# Patient Record
Sex: Male | Born: 1950 | Race: White | Hispanic: No | Marital: Married | Smoking: Never smoker
Health system: Southern US, Community
[De-identification: ages and names within clinical notes are randomized; demographics above are authoritative.]

## PROBLEM LIST (undated history)

## (undated) DIAGNOSIS — N419 Inflammatory disease of prostate, unspecified: Secondary | ICD-10-CM

## (undated) DIAGNOSIS — I10 Essential (primary) hypertension: Secondary | ICD-10-CM

## (undated) DIAGNOSIS — K7689 Other specified diseases of liver: Secondary | ICD-10-CM

## (undated) DIAGNOSIS — R51 Headache: Secondary | ICD-10-CM

## (undated) DIAGNOSIS — I2510 Atherosclerotic heart disease of native coronary artery without angina pectoris: Secondary | ICD-10-CM

## (undated) DIAGNOSIS — J189 Pneumonia, unspecified organism: Secondary | ICD-10-CM

## (undated) DIAGNOSIS — K760 Fatty (change of) liver, not elsewhere classified: Secondary | ICD-10-CM

## (undated) DIAGNOSIS — M1990 Unspecified osteoarthritis, unspecified site: Secondary | ICD-10-CM

## (undated) DIAGNOSIS — E785 Hyperlipidemia, unspecified: Secondary | ICD-10-CM

## (undated) DIAGNOSIS — K8020 Calculus of gallbladder without cholecystitis without obstruction: Secondary | ICD-10-CM

## (undated) DIAGNOSIS — R935 Abnormal findings on diagnostic imaging of other abdominal regions, including retroperitoneum: Secondary | ICD-10-CM

## (undated) HISTORY — PX: ACROMIO-CLAVICULAR JOINT REPAIR: SHX5183

## (undated) HISTORY — DX: Calculus of gallbladder without cholecystitis without obstruction: K80.20

## (undated) HISTORY — DX: Abnormal findings on diagnostic imaging of other abdominal regions, including retroperitoneum: R93.5

## (undated) HISTORY — DX: Inflammatory disease of prostate, unspecified: N41.9

## (undated) HISTORY — DX: Unspecified osteoarthritis, unspecified site: M19.90

## (undated) HISTORY — DX: Headache: R51

## (undated) HISTORY — DX: Hyperlipidemia, unspecified: E78.5

## (undated) HISTORY — DX: Fatty (change of) liver, not elsewhere classified: K76.0

## (undated) HISTORY — DX: Essential (primary) hypertension: I10

## (undated) HISTORY — PX: VASECTOMY: SHX75

## (undated) HISTORY — DX: Other specified diseases of liver: K76.89

---
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
## 1979-04-24 HISTORY — PX: ACROMIO-CLAVICULAR JOINT REPAIR: SHX5183

## 1996-07-09 ENCOUNTER — Encounter: Payer: Self-pay | Admitting: Family Medicine

## 1996-07-09 LAB — CONVERTED CEMR LAB: TSH: 1.88 microintl units/mL

## 2000-04-23 HISTORY — PX: TEMPORAL ARTERY BIOPSY / LIGATION: SUR132

## 2000-04-23 HISTORY — PX: OTHER SURGICAL HISTORY: SHX169

## 2000-05-10 ENCOUNTER — Encounter: Payer: Self-pay | Admitting: Family Medicine

## 2000-05-10 LAB — CONVERTED CEMR LAB
Blood Glucose, Fasting: 122 mg/dL
PSA: 0.4 ng/mL
RBC count: 5.1 10*6/uL
WBC, blood: 12.1 10*3/uL

## 2000-05-30 ENCOUNTER — Encounter: Payer: Self-pay | Admitting: Family Medicine

## 2000-05-30 LAB — CONVERTED CEMR LAB: RBC count: 5.19 10*6/uL

## 2000-06-14 ENCOUNTER — Ambulatory Visit (HOSPITAL_COMMUNITY): Admission: RE | Admit: 2000-06-14 | Discharge: 2000-06-14 | Payer: Self-pay | Admitting: Family Medicine

## 2000-06-14 ENCOUNTER — Encounter: Payer: Self-pay | Admitting: Family Medicine

## 2000-07-22 HISTORY — PX: OTHER SURGICAL HISTORY: SHX169

## 2001-06-13 ENCOUNTER — Encounter: Payer: Self-pay | Admitting: Urology

## 2001-06-13 ENCOUNTER — Encounter: Admission: RE | Admit: 2001-06-13 | Discharge: 2001-06-13 | Payer: Self-pay | Admitting: Urology

## 2001-06-26 ENCOUNTER — Encounter: Payer: Self-pay | Admitting: Urology

## 2001-06-26 ENCOUNTER — Inpatient Hospital Stay (HOSPITAL_COMMUNITY): Admission: EM | Admit: 2001-06-26 | Discharge: 2001-07-03 | Payer: Self-pay | Admitting: Urology

## 2001-06-26 ENCOUNTER — Encounter (INDEPENDENT_AMBULATORY_CARE_PROVIDER_SITE_OTHER): Payer: Self-pay | Admitting: Specialist

## 2001-07-03 ENCOUNTER — Encounter: Payer: Self-pay | Admitting: Urology

## 2001-07-10 ENCOUNTER — Encounter: Admission: RE | Admit: 2001-07-10 | Discharge: 2001-07-10 | Payer: Self-pay | Admitting: Urology

## 2001-07-10 ENCOUNTER — Encounter: Payer: Self-pay | Admitting: Urology

## 2001-10-21 DIAGNOSIS — N419 Inflammatory disease of prostate, unspecified: Secondary | ICD-10-CM

## 2001-10-21 HISTORY — DX: Inflammatory disease of prostate, unspecified: N41.9

## 2001-11-10 DIAGNOSIS — R935 Abnormal findings on diagnostic imaging of other abdominal regions, including retroperitoneum: Secondary | ICD-10-CM

## 2001-11-10 HISTORY — DX: Abnormal findings on diagnostic imaging of other abdominal regions, including retroperitoneum: R93.5

## 2001-12-11 ENCOUNTER — Encounter: Payer: Self-pay | Admitting: Family Medicine

## 2001-12-11 LAB — CONVERTED CEMR LAB: PSA: 0.59 ng/mL

## 2004-11-07 ENCOUNTER — Ambulatory Visit: Payer: Self-pay | Admitting: Family Medicine

## 2005-01-03 ENCOUNTER — Ambulatory Visit: Payer: Self-pay | Admitting: Family Medicine

## 2005-01-03 LAB — CONVERTED CEMR LAB
Blood Glucose, Fasting: 99 mg/dL
PSA: 0.59 ng/mL
TSH: 1.39 u[IU]/mL

## 2005-01-05 ENCOUNTER — Ambulatory Visit: Payer: Self-pay | Admitting: Family Medicine

## 2005-01-18 ENCOUNTER — Ambulatory Visit: Payer: Self-pay | Admitting: Gastroenterology

## 2005-01-31 ENCOUNTER — Encounter (INDEPENDENT_AMBULATORY_CARE_PROVIDER_SITE_OTHER): Payer: Self-pay | Admitting: Specialist

## 2005-01-31 ENCOUNTER — Ambulatory Visit: Payer: Self-pay | Admitting: Gastroenterology

## 2005-01-31 LAB — HM COLONOSCOPY

## 2005-02-01 ENCOUNTER — Ambulatory Visit: Payer: Self-pay | Admitting: Family Medicine

## 2005-11-13 ENCOUNTER — Ambulatory Visit: Payer: Self-pay | Admitting: Family Medicine

## 2006-03-28 ENCOUNTER — Ambulatory Visit: Payer: Self-pay | Admitting: Family Medicine

## 2006-04-01 ENCOUNTER — Ambulatory Visit: Payer: Self-pay | Admitting: Family Medicine

## 2006-04-01 LAB — CONVERTED CEMR LAB
PSA: 1.98 ng/mL
TSH: 1.13 microintl units/mL

## 2007-03-27 ENCOUNTER — Encounter (INDEPENDENT_AMBULATORY_CARE_PROVIDER_SITE_OTHER): Payer: Self-pay | Admitting: *Deleted

## 2007-07-08 ENCOUNTER — Encounter: Payer: Self-pay | Admitting: Family Medicine

## 2007-07-08 DIAGNOSIS — G44019 Episodic cluster headache, not intractable: Secondary | ICD-10-CM | POA: Insufficient documentation

## 2007-07-08 DIAGNOSIS — N4289 Other specified disorders of prostate: Secondary | ICD-10-CM | POA: Insufficient documentation

## 2007-07-08 DIAGNOSIS — J309 Allergic rhinitis, unspecified: Secondary | ICD-10-CM | POA: Insufficient documentation

## 2007-07-10 DIAGNOSIS — E7800 Pure hypercholesterolemia, unspecified: Secondary | ICD-10-CM | POA: Insufficient documentation

## 2007-07-10 DIAGNOSIS — I10 Essential (primary) hypertension: Secondary | ICD-10-CM | POA: Insufficient documentation

## 2007-07-18 ENCOUNTER — Ambulatory Visit: Payer: Self-pay | Admitting: Family Medicine

## 2007-07-19 LAB — CONVERTED CEMR LAB
Albumin: 4.8 g/dL (ref 3.5–5.2)
Bilirubin, Direct: 0.1 mg/dL (ref 0.0–0.3)
CO2: 27 meq/L (ref 19–32)
Chloride: 100 meq/L (ref 96–112)
Glucose, Bld: 81 mg/dL (ref 70–99)
HDL: 50 mg/dL (ref >39)
LDL Cholesterol: 132 mg/dL — ABNORMAL HIGH (ref 0–99)
PSA: 0.65 ng/mL (ref 0.10–4.00)
Sodium: 140 meq/L (ref 135–145)
TSH: 0.902 microintl units/mL (ref 0.350–5.50)
Total Bilirubin: 0.9 mg/dL (ref 0.3–1.2)
Total CHOL/HDL Ratio: 4.3

## 2007-07-24 ENCOUNTER — Ambulatory Visit: Payer: Self-pay | Admitting: Family Medicine

## 2007-07-31 ENCOUNTER — Encounter: Payer: Self-pay | Admitting: Family Medicine

## 2007-08-07 ENCOUNTER — Encounter: Payer: Self-pay | Admitting: Family Medicine

## 2007-08-15 ENCOUNTER — Emergency Department (HOSPITAL_COMMUNITY): Admission: EM | Admit: 2007-08-15 | Discharge: 2007-08-16 | Payer: Self-pay | Admitting: Emergency Medicine

## 2008-01-19 ENCOUNTER — Encounter: Payer: Self-pay | Admitting: Family Medicine

## 2009-11-24 ENCOUNTER — Encounter (INDEPENDENT_AMBULATORY_CARE_PROVIDER_SITE_OTHER): Payer: Self-pay | Admitting: *Deleted

## 2010-02-20 ENCOUNTER — Ambulatory Visit: Payer: Self-pay | Admitting: Family Medicine

## 2010-02-21 LAB — CONVERTED CEMR LAB
Albumin: 4.1 g/dL (ref 3.5–5.2)
Alkaline Phosphatase: 54 units/L (ref 39–117)
Cholesterol: 206 mg/dL — ABNORMAL HIGH (ref 0–200)
Direct LDL: 125.3 mg/dL
GFR calc non Af Amer: 78.22 mL/min (ref >60)
HDL: 44.1 mg/dL (ref >39.00)
PSA: 0.66 ng/mL (ref 0.10–4.00)
Potassium: 4.7 meq/L (ref 3.5–5.1)
Sodium: 141 meq/L (ref 135–145)
TSH: 1.32 microintl units/mL (ref 0.35–5.50)
Total Protein: 6.9 g/dL (ref 6.0–8.3)
Triglycerides: 198 mg/dL — ABNORMAL HIGH (ref 0.0–149.0)
VLDL: 39.6 mg/dL (ref 0.0–40.0)

## 2010-02-23 ENCOUNTER — Ambulatory Visit: Payer: Self-pay | Admitting: Family Medicine

## 2010-05-23 NOTE — Letter (Signed)
Summary: Nadara Eaton letter  [place] at [facility]  [address] [place], [place] [address]   Phone: [phone]  Fax: [phone]       11/24/2009 MRN: [id number]  David Villanueva [address] [place], [place]  [address]  Dear Mr. [name] [facility] [place], and [place] announce the retirement of Arta Silence, M.D., from full-time practice at the [facility] office effective October 20, 2009 and his plans of returning part-time.  It is important to Dr. Hetty Ely and to our practice that you understand that [facility] has seven physicians in our office for your health care needs.  We will continue to offer the same exceptional care that you have today.    Dr. Hetty Ely has spoken to many of you about his plans for retirement and returning part-time in the fall.   We will continue to work with you through the transition to schedule appointments for you in the office and meet the high standards that [place] is committed to.   Again, it is with great pleasure that we share the news that Dr. Hetty Ely will return to [facility] in October of 2011 with a reduced schedule.    If you have any questions, or would like to request an appointment with one of our physicians, please call us at [phone] and press the option for Scheduling an appointment.  We take pleasure in providing you with excellent patient care and look forward to seeing you at your next office visit.  Our [facility] Physicians are:  Tillman Abide, M.D. Laurita Quint, M.D. Roxy Manns, M.D. Kerby Nora, M.D. Hannah Beat, M.D. Ruthe Mannan, M.D. We proudly welcomed Raechel Ache, M.D. and Eustaquio Boyden, M.D. to the practice in July/August 2011.  Sincerely,  [facility]

## 2010-05-23 NOTE — Assessment & Plan Note (Signed)
Summary: cpx   Vital Signs:  Patient profile:   60 year old male Height:      70 inches Weight:      199 pounds BMI:     28.66 Temp:     97.3 degrees F oral Pulse rate:   60 / minute Pulse rhythm:   regular BP sitting:   132 / 78  (left arm) Cuff size:   large  Vitals Entered By: Sydell Axon LPN 03-21-10 10:43 AM) CC: 30 Minute checkup, had a colonoscopy 10/06 by Dr. Christella Hartigan   History of Present Illness: Pt here for Comp Exam. He is living in the [address] and in a condo in [place].  he feels well and has teeth issues. He has had a crack in a tooth that failed and then had trhe tooth pulled with shifting of the teeth. He is now going to an Transport planner.  He is on HCTZ via Dr Shari Heritage, cardiologist in [place].  Preventive Screening-Counseling & Management  Alcohol-Tobacco     Alcohol drinks/day: 0     Smoking Status: never     Passive Smoke Exposure: no  Caffeine-Diet-Exercise     Caffeine use/day: 3     Does Patient Exercise: no     Type of exercise: works Holiday representative.  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 One Tab By Mouth Qam. 2)  Combination Bp Has No Idea What It Is  Allergies: 1)  ! * Demerol 2)  ! * Osmoprep  Past History:  Past Surgical History: Last updated: 07/24/2007 VASECTOMY LEFT A/C SEPERATION MRI// MRA BRAIN:--NEGATIVE:(2002) TEMPORAL ARTERY:--NEGATIVE:(2002) CAROTID DOPPLER NEGATIVE// ALSO MRA NEGATIVE:(07/2000) PROSTATITIS-- PROSTATE ABSCESS:(DR. TANNENBAUM):(10/2001) C.T. OF PELVIS NORMAL:(11/10/2001) COLONOSCOPY POLYP; BIOPSY NEGATIVE: (Dr  Christella Hartigan) (01/31/2005)   Family History: Last updated: 03/21/10 Father:DECEASED AT 41 YOA OPEN HEART SURGERY// MI MULTIPLE TIMES  Mother: A  85  RUPTURED DISC SISTER A  55  ?Lyme Dz CV: + FATHER MI/ +PGF 96 YOA HBP: +PGM/ + SELF DM: NEGATIVE GOUT/ARTHRITIS: NEGATIVE PROSTATE CANCER:  BREAST/OVARIAN/UTERINE CANCER: ?PGM DECEASED AT 89 YOA COLON CANCER: + MAUNT DEPRESSION: NEGATIVE ETOH/DRUG ABUSE: NEGATIVE OTHER: + STROKE PGF  Social History: Last updated: 21-Mar-2010 Marital Status: Married  LIVES WITH WIFE Children: 2 CHILDREN 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  Risk Factors: Alcohol Use: 0 (03-21-10) Caffeine Use: 3 (2010/03/21) Exercise: no (03-21-2010)  Risk Factors: Smoking Status: never (2010/03/21) Passive Smoke Exposure: no (03/21/10)  Family History: Father:DECEASED AT 41 YOA OPEN HEART SURGERY// MI MULTIPLE TIMES  Mother: A  85  RUPTURED DISC SISTER A  55  ?[place]yme Dz CV: + FATHER MI/ +PGF 96 YOA HBP: +PGM/ + SELF DM: NEGATIVE GOUT/ARTHRITIS: NEGATIVE PROSTATE CANCER:  BREAST/OVARIAN/UTERINE CANCER: ?PGM DECEASED AT 89 YOA COLON CANCER: + MAUNT DEPRESSION: NEGATIVE ETOH/DRUG ABUSE: NEGATIVE OTHER: + STROKE PGF  Social History: Marital Status: Married  LIVES WITH WIFE Children: 2 CHILDREN 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  Review of Systems General:  Denies chills, fatigue, fever, sweats, weakness, and weight loss. Eyes:  Complains of  blurring; denies discharge and eye pain; presbyopia. ENT:  Denies decreased hearing, earache, and ringing in ears. CV:  Denies chest pain or discomfort, fainting, fatigue, palpitations, shortness of breath with exertion, swelling of feet, and swelling of hands. Resp:  Denies cough, shortness of breath, and wheezing. GI:  Denies abdominal pain, bloody stools, change in  bowel habits, constipation, dark tarry stools, diarrhea, indigestion, loss of appetite, nausea, vomiting, vomiting blood, and yellowish skin color. GU:  Denies discharge, dysuria, nocturia, and urinary frequency. MS:  Denies joint pain, low back pain, muscle aches, cramps, and stiffness. Derm:  Denies dryness, itching, and rash. Neuro:  Denies numbness, poor balance, tingling, and tremors.  Physical Exam  General:  Well-developed,well-nourished,in no acute distress; alert,appropriate and cooperative throughout examination Head:  Normocephalic and atraumatic without obvious abnormalities. No apparent alopecia or balding. Eyes:  Conjunctiva clear bilaterally.  Ears:  External ear exam shows no significant lesions or deformities.  Otoscopic examination reveals clear canals, tympanic membranes are intact bilaterally without bulging, retraction, inflammation or discharge. Hearing is grossly normal bilaterally. Nose:  External nasal examination shows no deformity or inflammation. Nasal mucosa are pink and moist without lesions or exudates. Mouth:  Oral mucosa and oropharynx without lesions or exudates.  Teeth in good repair. Neck:  No deformities, masses, or tenderness noted. Chest Wall:  No deformities, masses, tenderness or gynecomastia noted. Breasts:  No masses or gynecomastia noted Lungs:  Normal respiratory effort, chest expands symmetrically. Lungs are clear to auscultation, no crackles or wheezes. Heart:  Normal rate and regular rhythm. S1 and S2 normal without gallop, murmur, click, rub or other extra sounds. Abdomen:  Bowel sounds positive,abdomen soft and non-tender without masses, organomegaly or hernias noted. Rectal:  No external abnormalities noted. Normal sphincter tone. No rectal masses or tenderness. Ext decompressed hemms. G neg. Genitalia:  Testes bilaterally descended without nodularity, tenderness or masses. No scrotal masses or lesions. No penis lesions or urethral  discharge. Prostate:  Prostate gland firm and smooth, no enlargement, nodularity, tenderness, mass, asymmetry or induration. 10gms. Msk:  No deformity or scoliosis noted of thoracic or lumbar spine.   Pulses:  R and L carotid,radial,femoral,dorsalis pedis and posterior tibial pulses are full and equal bilaterally Extremities:  No clubbing, cyanosis, edema, or deformity noted with normal full range of motion of all joints.   Neurologic:  No cranial nerve deficits noted. Station and gait are normal. Sensory, motor and coordinative functions appear intact. Skin:  Intact without suspicious lesions or rashes, Aks and Acne scarring of the shoulders and upper back. Cervical Nodes:  No lymphadenopathy noted Inguinal Nodes:  No significant adenopathy Psych:  Cognition and judgment appear intact. Alert and cooperative with normal attention span and concentration. No apparent delusions, illusions, hallucinations   Impression & Recommendations:  Problem # 1:  HEALTH MAINTENANCE EXAM (ICD-V70.0) Assessment Comment Only  Reviewed preventive care protocols, scheduled due services, and updated immunizations.  Problem # 2:  SPECIAL SCREENING MALIGNANT NEOPLASM OF PROSTATE (ICD-V76.44) Assessment: Unchanged Stable PSA and exam.  Problem # 3:  HYPERTENSION (ICD-401.9) Assessment: Unchanged Stable. His updated medication list for this problem includes:    Hydrochlorothiazide 25 Mg Tabs (Hydrochlorothiazide) ..... One tab by mouth every am.  BP today: 132/78 Prior BP: 120/80 (07/24/2007)  Labs Reviewed: K+: 4.7 (02/20/2010) Creat: : 1.0 (02/20/2010)   Chol: 206 (02/20/2010)   HDL: 44.10 (02/20/2010)   LDL: 132 (07/18/2007)   TG: 198.0 (02/20/2010)  Problem # 4:  HYPERCHOLESTEROLEMIA (ICD-272.0) Assessment: Unchanged OK but LDL and  Trigs could be improved for better future health...watch diet more carefully. 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   Complete Medication List: 1)  Hydrochlorothiazide 25 Mg Tabs (Hydrochlorothiazide) .... One tab by mouth every am.  Patient Instructions: 1)  RTC as needed.   Orders Added: 1)  Est. Patient 40-64 years [99396]    Current Allergies (reviewed today): ! * DEMEROL ! * OSMOPREP

## 2010-09-08 NOTE — H&P (Signed)
[facility]  Patient:    David Villanueva, David Villanueva Visit Number: [id number] MRN: [id number]          Service Type: MED Location: 3W 0371 01 Attending Physician:  Laqueta Jean Dictated by:   Vonzell Schlatter [name], M.D. Admit Date:  06/26/2001                           History and Physical  HISTORY OF PRESENT ILLNESS:  The patient is a 60 year old married white male from [place], [place], who has been treated in the outpatient setting for acute prostatitis.  The patient was treated with Levaquin 500 mg 1 per day, hot baths, and analgesics.  The patient returned after a 10 course, but was no better.  He was continued on his antibiotic, but returned today with urinary retention of 250 cc.  The patient had prostate ultrasound, showing possible developing prostate abscess.  Foley catheter was placed for acute urinary retention but the patient was unable to tolerate pain from irritation of the catheter on the prostate, and is now admitted for IV antibiotic, and pain medication.  He will undergo a cystoscopy and transrectal ultrasound and possible drainage of his abscess.  PAST MEDICAL HISTORY:  Noncontributory.  ALCOHOL:  Occasional.  TOBACCO:  None.  ALLERGIES: TETRACYCLINE - flank pain.  SOCIAL HISTORY:  The patient is married, with no children.  PHYSICAL EXAMINATION:  GENERAL:  A well-developed white male in no acute distress.  VITAL SIGNS:  Temperature 97.1, heart rate 58, respiratory rate 18, blood pressure 161/97.  Weight is 193.6.  HEENT:  PERRL, EOM full.  NECK:  Supple, nontender, no nodes.  CHEST:  Clear to percussion and auscultation.  ABDOMEN:  Soft, positive bowel sounds, no organomegaly, without masses.  RECTAL/GU:  Shows acute hot, swollen prostate which is tender to palpation. Pubic area is negative.  Testicles are descending bilaterally without evidence of orchitis or epididymitis.  EXTREMITIES:  Without cyanosis  or edema.  NEUROLOGIC:  Physiologic.  IMPRESSION:  Prostatitis with impending prostatic abscess. Dictated by:   Vonzell Schlatter [name], M.D. Attending Physician:  Laqueta Jean DD:  06/26/01 TD:  06/27/01 Job: [id number]

## 2010-09-08 NOTE — Op Note (Signed)
[facility]  Patient:    David Villanueva, David Villanueva Visit Number: [id number] MRN: [id number]          Service Type: MED Location: 3W 0371 01 Attending Physician:  Laqueta Jean Dictated by:   Vonzell Schlatter [name], M.D. Proc. 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  PROCEDURE:  Flexible cystoscopy and suprapubic catheterization.  SURGEON:  Sigmund I. [name], M.D.  ANESTHESIA:  General LMA.  PREPARATION:  After appropriate preanesthesia, the patient is brought to the operating room and placed on the operating table in the dorsal supine position, where general LMA and mask anesthesia was introduced.  He remained in the supine position, where the pubis was shaven, prepped with Betadine solution, draped in the usual fashion.  DESCRIPTION OF PROCEDURE:  Flexible cystoscopy revealed a hyperemic prostate with acute and chronic inflammatory changes.  With the patient in slight Trendelenburg position, the Microvasive suprapubic catheter was placed under direct vision into the bladder and a J coiled with a suture and locked into position.  The bladder drained clear fluid, and the cystoscope was removed. The patient was then awakened and taken to recovery in good condition. Dictated by:   Vonzell Schlatter [name], M.D. Attending Physician:  Laqueta Jean DD:  07/02/01 TD:  07/03/01 Job: [id number]

## 2010-09-08 NOTE — Discharge Summary (Signed)
[facility]  Patient:    David Villanueva, David Villanueva Visit Number: [id number] MRN: [id number]          Service Type: MED Location: 3W 0371 01 Attending Physician:  Laqueta Jean Dictated by:   Vonzell Schlatter [name], M.D. Admit Date:  06/26/2001 Discharge Date: 07/03/2001                             Discharge Summary  FINAL DIAGNOSES: 1. Acute on chronic prostatitis. 2. Prostatic infarction. 3. Chronic urinary retention.  PROCEDURES: 1. June 30, 2001, cystourethroscopy, ultrasonography with interpretation of    bladder, transrectal needle biopsy of the prostate. 2. July 02, 2001, flexible cystoscopy and suprapubic catheterization.  HISTORY OF PRESENT ILLNESS:  Mr. Carpenito is a 60 year old married white male from [place], [place].  He was treated in the outpatient setting for acute prostatitis with Septra double strength 1 b.i.d. and Levaquin.  However, after three weeks, the patient became worse with severe dysuria, acute urinary retention of 250 cc.  The patient had a prostatic ultrasound showing possible developing prostate abscess.  Foley catheter was placed.  Because of severe irritation from the catheterization, the patient was admitted for IV antibiotics and pain medication.  PAST MEDICAL HISTORY:  Noncontributory.  HABITS:  Alcohol: Occasional.  Tobacco: None.  ALLERGIES:  TETRACYCLINE (flank pain).  SOCIAL HISTORY:  The patient is married with no children.  ADMISSION PHYSICAL EXAMINATION:  GENERAL:  Well-developed white male in no acute distress.  The remaining physical examination is as noted on H&P of June 26, 2001.  ADMISSION LABORATORY DATA:  White blood cell count 9300, hemoglobin 13.3, hematocrit 38.4, platelet count 346,000.  Sodium 139, potassium 4.7, chloride 107, CO2 30, BUN 13, creatinine 1.0.  HOSPITAL COURSE:  The patient was given an IV course of gentamicin therapy per pharmacy protocol.  He underwent  cystoscopy and coincidental prostate ultrasonography with prostate biopsy of the abnormality found on ultrasound with the pathologic interpretation showing acute on chronic inflammation and infarction of the prostate.   The patient was given multiple attempts at voiding, but he continued to fail voiding trials despite double-dose Levaquin as well as low-dose Valium therapy.  Therefore, on July 02, 2001, the patient underwent flexible cystoscopy and insertion of suprapubic catheter.  He was allowed to be discharged with suprapubic tube in place.  This will be removed when the patient is able to void adequately on his own.  DISCHARGE MEDICATIONS:   He will be discharged on antibiotics and pain medications. Dictated by:   Vonzell Schlatter [name], M.D. Attending Physician:  Laqueta Jean DD:  07/02/01 TD:  07/04/01 Job: [id number]

## 2010-09-08 NOTE — Op Note (Signed)
[facility]  Patient:    David Villanueva, David Villanueva Visit Number: [id number] MRN: [id number]          Service Type: MED Location: 3W 0371 01 Attending Physician:  Laqueta Jean Dictated by:   Vonzell Schlatter [name], M.D. Proc. Date: 06/30/01 Admit Date:  06/26/2001                             Operative Report  PREOPERATIVE DIAGNOSIS:  Intraprostatic abscess.  POSTOPERATIVE DIAGNOSIS:  Intraprostatic abscess.  OPERATION:  Cystourethroscopy, transrectal prostatic massage, transrectal ultrasonography with interpretation, transrectal needle biopsy.  SURGEON:  Sigmund I. [name], M.D.  ANESTHESIA:  General (LMA)  PREPARATION:  After appropriate preanesthesia, the patient is brought to the operating room and placed upon the operating table in the dorsal supine position where a general LMA anesthesia was introduced.  He was then re-placed in the dorsal lithotomy position where the pubis was prepped with Betadine solution and draped in the usual fashion.  DESCRIPTION OF PROCEDURE:  Cystourethroscopy was accomplished with the rigid cystoscope, which showed a grossly inflamed prostatic fossa with thick mucus plugs emanating from the right side of the prostate.  This is consistent with a previous prostate ultrasound last week, which showed that the patient had an abnormal hypoechoic area consistent with prostatic abscess.  Vigorous massage was undertaken from the rectum, which showed that the prostate was 3+ and very firm.  Following this, transrectal ultrasonography was accomplished with interpretation, which again showed an abnormal hypoechoic area in the transitional zone, and biopsies were taken of this area.  There was no bleeding noted, and The patient tolerated this procedure well.  A sterile dressing was applied to the biopsy site.  Repeat cystoscopy showed some more mucus and clot within the bladder, and this was irrigated free from the bladder.   The patient was given IV Toradol as well as intraoperative antibiotic, awakened, and taken to the recovery room in good condition.  It was elected to leave the Foley catheter out to see if he could void. Dictated by:   Vonzell Schlatter [name], M.D. Attending Physician:  Laqueta Jean DD:  06/30/01 TD:  06/30/01 Job: [id number]

## 2010-11-27 ENCOUNTER — Telehealth: Payer: Self-pay | Admitting: *Deleted

## 2010-11-27 NOTE — Telephone Encounter (Signed)
I agree if David Villanueva agrees.

## 2010-11-27 NOTE — Telephone Encounter (Signed)
Patient would like to switch from Dr. Hetty Ely to Dr. Para March. Please advise.

## 2010-11-28 NOTE — Telephone Encounter (Signed)
Fine with me

## 2010-11-29 NOTE — Telephone Encounter (Signed)
Patient advised as instructed via telephone. 

## 2010-12-04 ENCOUNTER — Telehealth: Payer: Self-pay | Admitting: Family Medicine

## 2010-12-04 NOTE — Telephone Encounter (Signed)
Pt had CPE with David Villanueva 11/11.  Please schedule him for CPE in 12/12.  Thanks.

## 2010-12-04 NOTE — Telephone Encounter (Signed)
Patient notified as instructed by telephone. Appointments scheduled. 

## 2011-01-16 LAB — CBC
HCT: 46.5
Hemoglobin: 15.9
MCHC: 34.2
RDW: 13.1

## 2011-01-16 LAB — DIFFERENTIAL
Basophils Absolute: 0
Basophils Relative: 0
Eosinophils Relative: 1
Monocytes Absolute: 0.8
Monocytes Relative: 7

## 2011-01-16 LAB — POCT I-STAT, CHEM 8
Calcium, Ion: 1.15
Glucose, Bld: 127 — ABNORMAL HIGH
HCT: 48
Hemoglobin: 16.3
TCO2: 27

## 2011-04-03 ENCOUNTER — Other Ambulatory Visit: Payer: Self-pay

## 2011-04-05 ENCOUNTER — Encounter: Payer: Self-pay | Admitting: Family Medicine

## 2011-04-18 ENCOUNTER — Other Ambulatory Visit: Payer: Self-pay | Admitting: Family Medicine

## 2011-04-18 DIAGNOSIS — Z125 Encounter for screening for malignant neoplasm of prostate: Secondary | ICD-10-CM

## 2011-04-18 DIAGNOSIS — I10 Essential (primary) hypertension: Secondary | ICD-10-CM

## 2011-04-25 ENCOUNTER — Other Ambulatory Visit (INDEPENDENT_AMBULATORY_CARE_PROVIDER_SITE_OTHER): Payer: BC Managed Care – PPO

## 2011-04-25 DIAGNOSIS — Z125 Encounter for screening for malignant neoplasm of prostate: Secondary | ICD-10-CM

## 2011-04-25 DIAGNOSIS — I10 Essential (primary) hypertension: Secondary | ICD-10-CM

## 2011-04-25 LAB — COMPREHENSIVE METABOLIC PANEL
CO2: 33 mEq/L — ABNORMAL HIGH (ref 19–32)
Calcium: 9.9 mg/dL (ref 8.4–10.5)
Creatinine, Ser: 1.3 mg/dL (ref 0.4–1.5)
GFR: 62.42 mL/min (ref >60.00)
Glucose, Bld: 99 mg/dL (ref 70–99)
Total Bilirubin: 0.9 mg/dL (ref 0.3–1.2)

## 2011-04-25 LAB — LIPID PANEL
Cholesterol: 268 mg/dL — ABNORMAL HIGH (ref 0–200)
Triglycerides: 237 mg/dL — ABNORMAL HIGH (ref 0.0–149.0)

## 2011-04-25 LAB — PSA: PSA: 0.77 ng/mL (ref 0.10–4.00)

## 2011-04-26 ENCOUNTER — Encounter: Payer: Self-pay | Admitting: Family Medicine

## 2011-04-27 ENCOUNTER — Encounter: Payer: Self-pay | Admitting: Family Medicine

## 2011-04-27 ENCOUNTER — Ambulatory Visit (INDEPENDENT_AMBULATORY_CARE_PROVIDER_SITE_OTHER): Payer: BC Managed Care – PPO | Admitting: Family Medicine

## 2011-04-27 VITALS — BP 132/88 | HR 68 | Temp 97.8°F | Wt 209.0 lb

## 2011-04-27 DIAGNOSIS — Z23 Encounter for immunization: Secondary | ICD-10-CM

## 2011-04-27 DIAGNOSIS — I10 Essential (primary) hypertension: Secondary | ICD-10-CM

## 2011-04-27 DIAGNOSIS — E7800 Pure hypercholesterolemia, unspecified: Secondary | ICD-10-CM

## 2011-04-27 DIAGNOSIS — N4289 Other specified disorders of prostate: Secondary | ICD-10-CM

## 2011-04-27 DIAGNOSIS — Z0000 Encounter for general adult medical examination without abnormal findings: Secondary | ICD-10-CM

## 2011-04-27 MED ORDER — AMLODIPINE BESYLATE 10 MG PO TABS: 10.0000 mg | ORAL_TABLET | Freq: Every day | ORAL | Status: DC

## 2011-04-27 MED ORDER — HYDROCHLOROTHIAZIDE 12.5 MG PO TABS: 12.5000 mg | ORAL_TABLET | Freq: Every day | ORAL | Status: DC

## 2011-04-27 NOTE — Progress Notes (Signed)
CPE- See plan.  Routine anticipatory guidance given to patient.  See health maintenance.  Hypertension:    Using medication without problems or lightheadedness: yes Chest pain with exertion:no Edema:no Short of breath:no  Chol elevated, no meds yet.  D/w pt.   H/o prostatitis, PSA wnl.   PMH and SH reviewed  Meds, vitals, and allergies reviewed.   ROS: See HPI.  Otherwise negative.    GEN: nad, alert and oriented HEENT: mucous membranes moist NECK: supple w/o LA CV: rrr. PULM: ctab, no inc wob ABD: soft, +bs EXT: no edema SKIN: no acute rash Prostate gland firm and smooth, minimal enlargement, but no nodularity, tenderness, mass, asymmetry or induration.

## 2011-04-27 NOTE — Patient Instructions (Signed)
Work on your [employer].  Don't change your meds.  I sent them to the pharmacy.  Take care.   Recheck labs in 1 year at a physical.

## 2011-04-29 ENCOUNTER — Encounter: Payer: Self-pay | Admitting: Family Medicine

## 2011-04-29 DIAGNOSIS — Z0000 Encounter for general adult medical examination without abnormal findings: Secondary | ICD-10-CM | POA: Insufficient documentation

## 2011-04-29 NOTE — Assessment & Plan Note (Signed)
No urinary sx and PSA wnl.

## 2011-04-29 NOTE — Assessment & Plan Note (Signed)
Controlled, work on diet and exercise.  No change in meds.

## 2011-04-29 NOTE — Assessment & Plan Note (Signed)
Work on diet and recheck periodically.

## 2011-04-29 NOTE — Assessment & Plan Note (Signed)
Flu done, td and colon up to date.  D/w pt about exercise and diet.  Healthy habits encouraged.

## 2013-03-30 ENCOUNTER — Other Ambulatory Visit: Payer: Self-pay | Admitting: Family Medicine

## 2013-03-30 DIAGNOSIS — E7800 Pure hypercholesterolemia, unspecified: Secondary | ICD-10-CM

## 2013-04-06 ENCOUNTER — Other Ambulatory Visit (INDEPENDENT_AMBULATORY_CARE_PROVIDER_SITE_OTHER): Payer: BC Managed Care – PPO

## 2013-04-06 DIAGNOSIS — E7800 Pure hypercholesterolemia, unspecified: Secondary | ICD-10-CM

## 2013-04-06 LAB — LIPID PANEL
Cholesterol: 218 mg/dL — ABNORMAL HIGH (ref 0–200)
HDL: 35.9 mg/dL — ABNORMAL LOW
Total CHOL/HDL Ratio: 6
Triglycerides: 143 mg/dL (ref 0.0–149.0)
VLDL: 28.6 mg/dL (ref 0.0–40.0)

## 2013-04-06 LAB — LDL CHOLESTEROL, DIRECT: Direct LDL: 160.2 mg/dL

## 2013-04-06 LAB — COMPREHENSIVE METABOLIC PANEL
ALT: 31 U/L (ref 0–53)
AST: 21 U/L (ref 0–37)
Albumin: 4.6 g/dL (ref 3.5–5.2)
CO2: 28 mEq/L (ref 19–32)
Calcium: 9.4 mg/dL (ref 8.4–10.5)
Chloride: 101 mEq/L (ref 96–112)
Creatinine, Ser: 1.4 mg/dL (ref 0.4–1.5)
GFR: 57.26 mL/min — ABNORMAL LOW (ref >60.00)
Potassium: 4.3 mEq/L (ref 3.5–5.1)
Total Protein: 7.8 g/dL (ref 6.0–8.3)

## 2013-04-10 ENCOUNTER — Ambulatory Visit (INDEPENDENT_AMBULATORY_CARE_PROVIDER_SITE_OTHER): Payer: BC Managed Care – PPO | Admitting: Family Medicine

## 2013-04-10 ENCOUNTER — Encounter: Payer: Self-pay | Admitting: Family Medicine

## 2013-04-10 VITALS — BP 138/84 | HR 71 | Temp 97.8°F | Ht 70.5 in | Wt 219.2 lb

## 2013-04-10 DIAGNOSIS — Z0000 Encounter for general adult medical examination without abnormal findings: Secondary | ICD-10-CM

## 2013-04-10 DIAGNOSIS — I10 Essential (primary) hypertension: Secondary | ICD-10-CM

## 2013-04-10 MED ORDER — HYDROCHLOROTHIAZIDE 25 MG PO TABS: 12.5000 mg | ORAL_TABLET | Freq: Every day | ORAL | Status: DC

## 2013-04-10 MED ORDER — AMLODIPINE BESYLATE 10 MG PO TABS: 10.0000 mg | ORAL_TABLET | Freq: Every day | ORAL | Status: DC

## 2013-04-10 NOTE — Progress Notes (Signed)
Pre-visit discussion using our clinic review tool. No additional management support is needed unless otherwise documented below in the visit note.  CPE- See plan.  Routine anticipatory guidance given to patient.  See health maintenance. Tetanus 2006 Flu shot encouraged.  Shingles d/w pt.  Colonoscopy 2006 Prostate cancer screening and PSA options (with potential risks and benefits of testing vs not testing) were discussed along with recent recs/guidelines.  He declined testing PSA at this point. Living will d/w pt.  Wife designated if incapacitated.   Diet and exercise d/w pt.  Weight is up.  Discussed.  Encouraged.  His job situation affected his diet.  He has an exercise plan.    Hypertension:    Using medication without problems or lightheadedness: yes Chest pain with exertion:no Edema:no Short of breath:no  PMH and SH reviewed  Meds, vitals, and allergies reviewed.   ROS: See HPI.  Otherwise negative.    GEN: nad, alert and oriented HEENT: mucous membranes moist NECK: supple w/o LA CV: rrr. PULM: ctab, no inc wob ABD: soft, +bs EXT: no edema SKIN: no acute rash

## 2013-04-10 NOTE — Patient Instructions (Addendum)
Check with your insurance to see if they will cover the shingles shot. I would get a flu shot each fall.   Don't change your meds.  Work on diet and exercise.   Take care.  Glad to see you.

## 2013-04-11 NOTE — Assessment & Plan Note (Signed)
Continue current meds, labs discussed.  Work on diet and exercise, weight.  F/u prn.

## 2013-04-11 NOTE — Assessment & Plan Note (Signed)
Routine anticipatory guidance given to patient.  See health maintenance. Tetanus 2006 Flu shot encouraged.  Shingles d/w pt.  Colonoscopy 2006 Prostate cancer screening and PSA options (with potential risks and benefits of testing vs not testing) were discussed along with recent recs/guidelines.  He declined testing PSA at this point. Living will d/w pt.  Wife designated if incapacitated.   Diet and exercise d/w pt.  Weight is up.  Discussed.  Encouraged.  His job situation affected his diet.  He has an exercise plan.

## 2013-04-13 ENCOUNTER — Telehealth: Payer: Self-pay

## 2013-04-13 MED ORDER — ZOSTER VACCINE LIVE 19400 UNT/0.65ML ~~LOC~~ SOLR: 0.6500 mL | Freq: Once | SUBCUTANEOUS | Status: DC

## 2013-04-13 NOTE — Telephone Encounter (Signed)
Sent zosta, shouldn't need order for flu.

## 2013-04-13 NOTE — Telephone Encounter (Signed)
David Villanueva left v/m requesting shingles vaccine rx to [facility]. Wants to get injection ASAP. If needs order for flu shot wants that sent also.

## 2013-04-13 NOTE — Telephone Encounter (Signed)
David Villanueva request shingles vaccine before end of year. Advised to contact ins co to see if covered at doctor's office or pharmacy and David Villanueva will contact office to let know if schedule nurse visit or shingles vaccine rx to what pharmacy.

## 2013-04-14 NOTE — Telephone Encounter (Signed)
Left detailed message on voicemail.  

## 2013-04-21 ENCOUNTER — Encounter: Payer: Self-pay | Admitting: Family Medicine

## 2013-07-16 ENCOUNTER — Encounter: Payer: Self-pay | Admitting: *Deleted

## 2014-03-05 ENCOUNTER — Other Ambulatory Visit: Payer: Self-pay | Admitting: Family Medicine

## 2014-10-10 ENCOUNTER — Other Ambulatory Visit: Payer: Self-pay | Admitting: Family Medicine

## 2014-10-10 DIAGNOSIS — E7800 Pure hypercholesterolemia, unspecified: Secondary | ICD-10-CM

## 2014-10-10 DIAGNOSIS — I10 Essential (primary) hypertension: Secondary | ICD-10-CM

## 2014-10-12 ENCOUNTER — Other Ambulatory Visit (INDEPENDENT_AMBULATORY_CARE_PROVIDER_SITE_OTHER): Payer: BLUE CROSS/BLUE SHIELD

## 2014-10-12 DIAGNOSIS — I10 Essential (primary) hypertension: Secondary | ICD-10-CM

## 2014-10-12 LAB — COMPREHENSIVE METABOLIC PANEL
ALK PHOS: 57 U/L (ref 39–117)
ALT: 21 U/L (ref 0–53)
AST: 14 U/L (ref 0–37)
Albumin: 4.1 g/dL (ref 3.5–5.2)
BILIRUBIN TOTAL: 0.6 mg/dL (ref 0.2–1.2)
BUN: 17 mg/dL (ref 6–23)
CO2: 29 mEq/L (ref 19–32)
Calcium: 9.4 mg/dL (ref 8.4–10.5)
Chloride: 104 mEq/L (ref 96–112)
Creatinine, Ser: 1.13 mg/dL (ref 0.40–1.50)
GFR: 69.95 mL/min (ref >60.00)
GLUCOSE: 90 mg/dL (ref 70–99)
POTASSIUM: 4.6 meq/L (ref 3.5–5.1)
SODIUM: 137 meq/L (ref 135–145)
TOTAL PROTEIN: 6.9 g/dL (ref 6.0–8.3)

## 2014-10-12 LAB — LIPID PANEL
CHOL/HDL RATIO: 5
CHOLESTEROL: 177 mg/dL (ref 0–200)
HDL: 34.4 mg/dL — AB (ref >39.00)
LDL Cholesterol: 106 mg/dL — ABNORMAL HIGH (ref 0–99)
NonHDL: 142.6
TRIGLYCERIDES: 181 mg/dL — AB (ref 0.0–149.0)
VLDL: 36.2 mg/dL (ref 0.0–40.0)

## 2014-10-15 ENCOUNTER — Ambulatory Visit (INDEPENDENT_AMBULATORY_CARE_PROVIDER_SITE_OTHER): Payer: BLUE CROSS/BLUE SHIELD | Admitting: Family Medicine

## 2014-10-15 ENCOUNTER — Encounter: Payer: Self-pay | Admitting: Family Medicine

## 2014-10-15 VITALS — BP 130/100 | HR 60 | Temp 98.3°F | Ht 71.0 in | Wt 210.8 lb

## 2014-10-15 DIAGNOSIS — Z7189 Other specified counseling: Secondary | ICD-10-CM

## 2014-10-15 DIAGNOSIS — Z0000 Encounter for general adult medical examination without abnormal findings: Secondary | ICD-10-CM | POA: Diagnosis not present

## 2014-10-15 DIAGNOSIS — I10 Essential (primary) hypertension: Secondary | ICD-10-CM

## 2014-10-15 DIAGNOSIS — Z23 Encounter for immunization: Secondary | ICD-10-CM | POA: Diagnosis not present

## 2014-10-15 DIAGNOSIS — Z1211 Encounter for screening for malignant neoplasm of colon: Secondary | ICD-10-CM

## 2014-10-15 NOTE — Patient Instructions (Addendum)
David Villanueva will call about your referral. If your BP is consistently >140/>90, then I would restart start the HCTZ.  If you get lightheaded, then stop it and let me know.   Check your BP in the AM, after urinating and then sitting down for a few minutes.   Take care.  Glad to see you.

## 2014-10-15 NOTE — Progress Notes (Signed)
Pre visit review using our clinic review tool, if applicable. No additional management support is needed unless otherwise documented below in the visit note.  CPE- See plan.  Routine anticipatory guidance given to patient.  See health maintenance. Tetanus 2016 Flu encouraged.  Declined.  He was tangential about this- mentioning that flu shots weren't effective, that he didn't need them, and that drug companies were profiting off vaccines.  I told him that flu shots were currently the best option for prevention of the disease, he was a candidate for vaccination, and that if he thought I was profiting off vaccination then he should leave and find a different clinic.  I told him the evidence for vaccination mattered, but his personal opinions did not. We moved on.  PNA due at 67 Shingles 2014 Colonoscopy due in 2016. D/w pt.   Prostate cancer screening and PSA options (with potential risks and benefits of testing vs not testing) were discussed along with recent recs/guidelines.  He declined testing PSA at this point. Living will - wife designated if patient were incapacitated.  Diet and exercise d/w pt.  Doing well, exercise - walking.  Diet is good.  Family has worked on Warden/ranger.    Hypertension:  Weight is down.  Off meds.  Has had variable BPs at home. See AVS re: BP.   PMH and SH reviewed  Meds, vitals, and allergies reviewed.   ROS: See HPI.  Otherwise negative.    GEN: nad, alert and oriented HEENT: mucous membranes moist NECK: supple w/o LA CV: rrr. PULM: ctab, no inc wob ABD: soft, +bs, asymptomatic umbilical hernia noted (he has no need for intervention, d/w pt) EXT: no edema SKIN: no acute rash

## 2014-10-16 DIAGNOSIS — Z7189 Other specified counseling: Secondary | ICD-10-CM | POA: Insufficient documentation

## 2014-10-16 NOTE — Assessment & Plan Note (Signed)
Routine anticipatory guidance given to patient.  See health maintenance. Tetanus 2016 Flu encouraged.  Declined.  He was tangential about this- mentioning that flu shots weren't effective, that he didn't need them, and that drug companies were profiting off vaccines.  I told him that flu shots were currently the best option for prevention of the disease, he was a candidate for vaccination, and that if he thought I was profiting off vaccination then he should leave and find a different clinic.  I told him the evidence for vaccination mattered, but his personal opinions did not. We moved on.  PNA due at 67 Shingles 2014 Colonoscopy due in 2016. D/w pt.   Prostate cancer screening and PSA options (with potential risks and benefits of testing vs not testing) were discussed along with recent recs/guidelines.  He declined testing PSA at this point. Living will - wife designated if patient were incapacitated.  Diet and exercise d/w pt.  Doing well, exercise - walking.  Diet is good.  Family has worked on Warden/ranger.

## 2014-10-16 NOTE — Assessment & Plan Note (Signed)
If BP is consistently >140/>90, then restart start the HCTZ. If lightheaded, then stop it and he'll let me know.  Check BP in the AM, after urinating and then sitting down for a few minutes. Labs d/w pt.

## 2014-11-22 ENCOUNTER — Encounter: Payer: Self-pay | Admitting: Gastroenterology

## 2015-01-14 ENCOUNTER — Ambulatory Visit (AMBULATORY_SURGERY_CENTER): Payer: Self-pay

## 2015-01-14 VITALS — Ht 70.5 in | Wt 213.0 lb

## 2015-01-14 DIAGNOSIS — Z800 Family history of malignant neoplasm of digestive organs: Secondary | ICD-10-CM

## 2015-01-14 MED ORDER — MOVIPREP 100 G PO SOLR
1.0000 | Freq: Once | ORAL | Status: DC
Start: 2015-01-14 — End: 2015-01-28

## 2015-01-14 NOTE — Progress Notes (Signed)
No allergies to eggs or soy No diet/weight loss meds No past problems with anesthesia No home oxygen  Refused emmi 

## 2015-01-28 ENCOUNTER — Encounter: Payer: Self-pay | Admitting: Gastroenterology

## 2015-01-28 ENCOUNTER — Ambulatory Visit (AMBULATORY_SURGERY_CENTER): Payer: BLUE CROSS/BLUE SHIELD | Admitting: Gastroenterology

## 2015-01-28 VITALS — BP 154/68 | HR 54 | Temp 97.0°F | Resp 15 | Ht 70.5 in | Wt 213.0 lb

## 2015-01-28 DIAGNOSIS — Z800 Family history of malignant neoplasm of digestive organs: Secondary | ICD-10-CM

## 2015-01-28 DIAGNOSIS — D122 Benign neoplasm of ascending colon: Secondary | ICD-10-CM

## 2015-01-28 DIAGNOSIS — Z1211 Encounter for screening for malignant neoplasm of colon: Secondary | ICD-10-CM | POA: Diagnosis present

## 2015-01-28 MED ORDER — SODIUM CHLORIDE 0.9 % IV SOLN: 500.0000 mL | INTRAVENOUS | Status: DC

## 2015-01-28 NOTE — Progress Notes (Signed)
Transferred to recovery room. A/O x3, pleased with MAC.  VSS.  Report to Jane, RN. 

## 2015-01-28 NOTE — Op Note (Signed)
[facility]  [address] [place], [address]   COLONOSCOPY PROCEDURE REPORT  PATIENT: David Villanueva, David Villanueva  MR#: [id number] BIRTHDATE: [birth year] , 71  yrs. old GENDER: male ENDOSCOPIST: Milus Banister, MD PROCEDURE DATE:  01/28/2015 PROCEDURE:   Colonoscopy, screening and Colonoscopy with snare polypectomy First Screening Colonoscopy - Avg.  risk and is 50 yrs.  old or older - No.  Prior Negative Screening - Now for repeat screening. 10 or more years since last screening  History of Adenoma - Now for follow-up colonoscopy & has been > or = to 3 yrs.  N/A  Polyps removed today? Yes ASA CLASS:   Class II INDICATIONS:Screening for colonic neoplasia and Colorectal Neoplasm Risk Assessment for this procedure is average risk. MEDICATIONS: Monitored anesthesia care and Propofol 200 mg IV  DESCRIPTION OF PROCEDURE:   After the risks benefits and alternatives of the procedure were thoroughly explained, informed consent was obtained.  The digital rectal exam revealed no abnormalities of the rectum.   The LB [id number]  endoscope was introduced through the anus and advanced to the cecum, which was identified by both the appendix and ileocecal valve. No adverse events experienced.   The quality of the prep was excellent.  The instrument was then slowly withdrawn as the colon was fully examined. Estimated blood loss is zero unless otherwise noted in this procedure report.   COLON FINDINGS: A sessile polyp measuring 5 mm in size was found in the ascending colon.  A polypectomy was performed with a cold snare.  The resection was complete, the polyp tissue was completely retrieved and sent to histology.   The examination was otherwise normal.  Retroflexed views revealed no abnormalities. The time to cecum = 3.4 Withdrawal time = 7.7   The scope was withdrawn and the procedure completed. COMPLICATIONS: There were no immediate complications.  ENDOSCOPIC IMPRESSION: 1.    Sessile polyp was found in the ascending colon; polypectomy was performed with a cold snare 2.   The examination was otherwise normal  RECOMMENDATIONS: If the polyp(s) removed today are proven to be adenomatous (pre-cancerous) polyps, you will need a repeat colonoscopy in 5 years.  Otherwise you should continue to follow colorectal cancer screening guidelines for "routine risk" patients with colonoscopy in 10 years.  You will receive a letter within 1-2 weeks with the results of your biopsy as well as final recommendations.  Please call my office if you have not received a letter after 3 weeks.  eSigned:  Milus Banister, MD 01/28/2015 10:59 AM   cc: Elsie Stain, MD

## 2015-01-28 NOTE — Progress Notes (Signed)
Called to room to assist during endoscopic procedure.  Patient ID and intended procedure confirmed with present staff. Received instructions for my participation in the procedure from the performing physician.  

## 2015-01-28 NOTE — Patient Instructions (Signed)
YOU HAD AN ENDOSCOPIC PROCEDURE TODAY AT THE [facility]:   Refer to the procedure report that was given to you for any specific questions about what was found during the examination.  If the procedure report does not answer your questions, please call your gastroenterologist to clarify.  If you requested that your care partner not be given the details of your procedure findings, then the procedure report has been included in a sealed envelope for you to review at your convenience later.  YOU SHOULD EXPECT: Some feelings of bloating in the abdomen. Passage of more gas than usual.  Walking can help get rid of the air that was put into your GI tract during the procedure and reduce the bloating. If you had a lower endoscopy (such as a colonoscopy or flexible sigmoidoscopy) you may notice spotting of blood in your stool or on the toilet paper. If you underwent a bowel prep for your procedure, you may not have a normal bowel movement for a few days.  Please Note:  You might notice some irritation and congestion in your nose or some drainage.  This is from the oxygen used during your procedure.  There is no need for concern and it should clear up in a day or so.  SYMPTOMS TO REPORT IMMEDIATELY:   Following lower endoscopy (colonoscopy or flexible sigmoidoscopy):  Excessive amounts of blood in the stool  Significant tenderness or worsening of abdominal pains  Swelling of the abdomen that is new, acute  Fever of 100F or higher   For urgent or emergent issues, a gastroenterologist can be reached at any hour by calling [phone].   DIET: Your first meal following the procedure should be a small meal and then it is ok to progress to your normal diet. Heavy or fried foods are harder to digest and may make you feel nauseous or bloated.  Likewise, meals heavy in dairy and vegetables can increase bloating.  Drink plenty of fluids but you should avoid alcoholic beverages for 24  hours.  ACTIVITY:  You should plan to take it easy for the rest of today and you should NOT DRIVE or use heavy machinery until tomorrow (because of the sedation medicines used during the test).    FOLLOW UP: Our staff will call the number listed on your records the next business day following your procedure to check on you and address any questions or concerns that you may have regarding the information given to you following your procedure. If we do not reach you, we will leave a message.  However, if you are feeling well and you are not experiencing any problems, there is no need to return our call.  We will assume that you have returned to your regular daily activities without incident.  If any biopsies were taken you will be contacted by phone or by letter within the next 1-3 weeks.  Please call us at [phone] if you have not heard about the biopsies in 3 weeks.    SIGNATURES/CONFIDENTIALITY: You and/or your care partner have signed paperwork which will be entered into your electronic medical record.  These signatures attest to the fact that that the information above on your After Visit Summary has been reviewed and is understood.  Full responsibility of the confidentiality of this discharge information lies with you and/or your care-partner.  Polyp information given.  

## 2015-01-31 ENCOUNTER — Telehealth: Payer: Self-pay | Admitting: *Deleted

## 2015-01-31 NOTE — Telephone Encounter (Signed)
  Follow up Call-  Call back number 01/28/2015  Post procedure Call Back phone  # [phone]  Permission to leave phone message Yes     Patient questions:  Did not leave a message. Person on VM was "Lynnae Sandhoff."  Patient does not have vernon listed.

## 2015-02-02 ENCOUNTER — Encounter: Payer: Self-pay | Admitting: Gastroenterology

## 2015-02-16 ENCOUNTER — Telehealth: Payer: Self-pay | Admitting: Family Medicine

## 2015-02-16 ENCOUNTER — Ambulatory Visit (INDEPENDENT_AMBULATORY_CARE_PROVIDER_SITE_OTHER): Payer: BLUE CROSS/BLUE SHIELD | Admitting: Family Medicine

## 2015-02-16 ENCOUNTER — Encounter: Payer: Self-pay | Admitting: Family Medicine

## 2015-02-16 VITALS — BP 120/72 | HR 62 | Temp 98.2°F | Wt 208.8 lb

## 2015-02-16 DIAGNOSIS — I10 Essential (primary) hypertension: Secondary | ICD-10-CM | POA: Diagnosis not present

## 2015-02-16 NOTE — Telephone Encounter (Signed)
Patient Name: [name] DOB: Oct 31, 1950 Initial Comment Caller states her husbands BP is 188/107.Marland Kitchen Headache and neck pain. Nurse Assessment Nurse: David Sa, RN, Cathy Date/Time (Eastern Time): 02/16/2015 9:13:06 AM Confirm and document reason for call. If symptomatic, describe symptoms. ---David Villanueva states that her husband developed high blood pressure with a headache and neck pain last night. Slight headache and slight neck pain this morning. His blood pressure was 188/107 this morning. Alert and responsive. No breathing difficulty. Has the patient traveled out of the country within the last 30 days? ---No Does the patient have any new or worsening symptoms? ---Yes Will a triage be completed? ---Yes Related visit to physician within the last 2 weeks? ---No Does the PT have any chronic conditions? (i.e. diabetes, asthma, etc.) ---Yes List chronic conditions. ---High Blood Pressure Guidelines Guideline Title Affirmed Question Affirmed Notes High Blood Pressure BP # 180/110 Headache [1] New headache AND [2] age > 50 Neck Pain or Stiffness [1] Age > 5 AND [2] no history of prior similar neck pain Final Disposition User See PCP within 2 David Just, RN, Tye [place] Comments Appointment scheduled for 6pm this evening with Dr. Damita Dunnings. Encouraged to call back with any concerns or questions before his appointment.  Referrals REFERRED TO PCP OFFICE Disagree/Comply: Comply

## 2015-02-16 NOTE — Telephone Encounter (Signed)
If emergent sx, then to ER- profound HA, altered mental status, abnormal neurologic symptoms.   If none of those, then would likely be okay to wait for the OV.

## 2015-02-16 NOTE — Telephone Encounter (Signed)
Noted. Thanks.

## 2015-02-16 NOTE — Patient Instructions (Addendum)
Go to the lab on the way out.  We'll contact you with your lab report. Stay on the HCTZ and amlodipine for now.  If your BP is consistently >140/>90, then let me know.   If your BP is <110/<70 or if you are lightheaded, then hold the HCTZ.  Take care.  Update me as needed.

## 2015-02-16 NOTE — Telephone Encounter (Signed)
Wife calling with concerns if they can wait until 6pm for pt's appt. Wife states pt has not been taking her medications on a regular basis and scared this headache and high BP might lead to something, wife did say his BP is coming down after he took the 2nd norvasc. I advised wife that this was taken off pt's med list and that because he hasn't been taking medications he should come in to see Dr. Damita Dunnings first. Please advise

## 2015-02-16 NOTE — Telephone Encounter (Signed)
Pt has called and spoken with [facility] again since this message.  Pt has an appointment at 6pm today made by [facility].  PLEASE NOTE: All timestamps contained within this report are represented as [place] Standard Time. CONFIDENTIALTY NOTICE: This fax transmission is intended only for the addressee. It contains information that is legally privileged, confidential or otherwise protected from use or disclosure. If you are not the intended recipient, you are strictly prohibited from reviewing, disclosing, copying using or disseminating any of this information or taking any action in reliance on or regarding this information. If you have received this fax in error, please notify us immediately by telephone so that we can arrange for its return to [place]. Phone: [phone], Toll-Free: [phone], Fax: [phone] Page: 1 of 2 Call Id: [id number] [place] Patient Name: David Villanueva Gender: Male DOB: 15-Dec-1950 Age: 64 Y 23 D Return Phone Number: [phone] (Primary) Address: City/State/Zip: [place] Client [place] Night - Client Client Site [place] Physician Renford Dills Contact Type Call Call Type Triage / Clinical Caller Name Jeani Hawking Relationship To Patient Spouse Return Phone Number [phone] (Primary) Chief Complaint Headache Initial Comment Caller states her husband has a bad headache.and his bp is 177/109 PreDisposition Go to ED Nurse Assessment Nurse: Rock Nephew, RN, Marliss Coots Date/Time (Eastern Time): 02/15/2015 7:22:27 PM Confirm and document reason for call. If symptomatic, describe symptoms. ---Caller states that her husband has a bad headache and his blood pressure is 177/109. His head and shoulders were hurting earlier. Has the patient traveled out of the country within the last 30 days? ---Not Applicable Does the patient have any new  or worsening symptoms? ---Yes Will a triage be completed? ---Yes Related visit to physician within the last 2 weeks? ---No Does the PT have any chronic conditions? (i.e. diabetes, asthma, etc.) ---Yes List chronic conditions. ---HTN Guidelines Guideline Title Affirmed Question Affirmed Notes Nurse Date/Time Eilene Ghazi Time) Headache [1] SEVERE headache (e.g., excruciating) AND [2] "worst headache" of life 9-10/10 pain scale Wells, RN, [place] 02/15/2015 7:22:43 PM Disp. Time Eilene Ghazi Time) Disposition Final User 02/15/2015 7:31:07 PM Go to ED Now (or PCP triage) Yes Rock Nephew, RN, Quintella Baton Understands: Yes Disagree/Comply: Comply PLEASE NOTE: All timestamps contained within this report are represented as [place] Standard Time. CONFIDENTIALTY NOTICE: This fax transmission is intended only for the addressee. It contains information that is legally privileged, confidential or otherwise protected from use or disclosure. If you are not the intended recipient, you are strictly prohibited from reviewing, disclosing, copying using or disseminating any of this information or taking any action in reliance on or regarding this information. If you have received this fax in error, please notify us immediately by telephone so that we can arrange for its return to [place]. Phone: [phone], Toll-Free: [phone], Fax: [phone] Page: 2 of 2 Call Id: [id number] Care Advice Given Per Guideline GO TO ED NOW (OR PCP TRIAGE): * IF NO PCP TRIAGE: You need to be seen. Go to the [facility] at [facility] within the next hour. Leave as soon as you can. DRIVING: Another adult should drive. CARE ADVICE given per Headache (Adult) guideline. After Care Instructions Given Call Event Type User Date / Time Description Referrals [facility] - ED

## 2015-02-16 NOTE — Telephone Encounter (Signed)
Patient notified as instructed by telephone and verbalized understanding. Patient stated that he has a headache, but is better today than yesterday. Patient stated that he is not having altered mental status or abnormal neurologic symptoms. Patient stated that he plans on coming to the appointment today, but if he changes his mind and goes to the ER he will call back and let Dr. Damita Dunnings know.

## 2015-02-16 NOTE — Progress Notes (Signed)
Pre visit review using our clinic review tool, if applicable. No additional management support is needed unless otherwise documented below in the visit note.  BP had been fine and was off meds until recently.  Had a HA yesterday.  BP was up yesterday on mult checks and restarted HCTZ and amlodipine yesterday.  Redosed both meds today.   Had a runny nose recently, but hadn't been on cold meds.   He does have some mild throat irritation, possibly from post nasal gtt.   No CP, SOB, BLE edema.  Less HA now, much improved.   Weight is stable, no more salt in the diet.   He was able to chop wood recently and didn't have CP.    He had his colonoscopy recently.   Meds, vitals, and allergies reviewed.   ROS: See HPI.  Otherwise, noncontributory.  GEN: nad, alert and oriented HEENT: mucous membranes moist, tm wnl, OP wnl NECK: supple w/o LA CV: rrr.  PULM: ctab, no inc wob ABD: soft, +bs EXT: no edema CN 2-12 wnl B

## 2015-02-17 LAB — BASIC METABOLIC PANEL
BUN: 17 mg/dL (ref 6–23)
CALCIUM: 10.4 mg/dL (ref 8.4–10.5)
CO2: 29 mEq/L (ref 19–32)
CREATININE: 1.27 mg/dL (ref 0.40–1.50)
Chloride: 99 mEq/L (ref 96–112)
GFR: 61.06 mL/min (ref >60.00)
Glucose, Bld: 89 mg/dL (ref 70–99)
Potassium: 4.6 mEq/L (ref 3.5–5.1)
Sodium: 138 mEq/L (ref 135–145)

## 2015-02-17 NOTE — Assessment & Plan Note (Signed)
Check bmet.   Stay on the HCTZ and amlodipine for now.  If BP is consistently >140/>90, then he'll let me know.  If BP is <110/<70 or if lightheaded, then hold the HCTZ.  Update me as needed.  Okay for outpatient f/u.

## 2015-02-23 ENCOUNTER — Encounter: Payer: Self-pay | Admitting: Internal Medicine

## 2015-02-23 ENCOUNTER — Ambulatory Visit (INDEPENDENT_AMBULATORY_CARE_PROVIDER_SITE_OTHER): Payer: BLUE CROSS/BLUE SHIELD | Admitting: Internal Medicine

## 2015-02-23 VITALS — BP 144/92 | HR 70 | Ht 70.5 in | Wt 213.8 lb

## 2015-02-23 DIAGNOSIS — E7800 Pure hypercholesterolemia, unspecified: Secondary | ICD-10-CM | POA: Diagnosis not present

## 2015-02-23 DIAGNOSIS — G44019 Episodic cluster headache, not intractable: Secondary | ICD-10-CM

## 2015-02-23 DIAGNOSIS — I10 Essential (primary) hypertension: Secondary | ICD-10-CM | POA: Diagnosis not present

## 2015-02-23 NOTE — Patient Instructions (Signed)
Your physician wants you to follow-up in: 6 months with Dr. Hilty. You will receive a reminder letter in the mail two months in advance. If you don't receive a letter, please call our office to schedule the follow-up appointment.    

## 2015-02-23 NOTE — Progress Notes (Signed)
OFFICE NOTE  Chief Complaint:  Hypertension, headache  Primary Care Physician: Elsie Stain, MD  HPI:  David Villanueva is a 64 year old male who was previously seen by Dr. Gwenlyn Found, although this was more than 5 years ago. He has a history of hypertension in the past and has taken medication as needed, due to the fact that his blood pressure tends to go up and down. Recently he was not feeling well and had headaches. He was noted to have significant hypertension with blood pressure 118/112. He called his primary care provider who suggested he go back on his medications. It seems that the medications have made a significant difference in blood pressure now is improved today was 144/92. He denies any further headache. He's not had any chest pain or worsening shortness of breath. He remains physically active. 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 He generally sleeps well at night and had a sleepiness score of 3, therefore obstructive sleep apnea is not suspected.  PMHx:  Past Medical History  Diagnosis Date  . Prostatitis 10/2001    Prostate abscess (Dr. Gaynelle Arabian)  . history of CT of pelvis 11/10/2001    normal  . Hypertension   . Hyperlipidemia   . Headache(784.0)     prev history, extensive w/u with no clear diagnosis    Past Surgical History  Procedure Laterality Date  . Vasectomy    . Mri//mra brain  2002    Negative  . Temporal artery biopsy / ligation  2002    Negative  . Carotid doppler  07/2000    Negative, also MRA negative  . Acromio-clavicular joint repair      FAMHx:  Family History  Problem Relation Age of Onset  . Heart disease Father     open heart surgery//  . Heart attack Father     multiple times  . Colon cancer Maternal Aunt     dx'd late in life  . Hypertension Paternal Grandmother   . Stroke Paternal Grandfather   . Prostate cancer Neg Hx   . Colon cancer Maternal  Grandfather     SOCHx:   reports that he has never smoked. He has never used smokeless tobacco. He reports that he does not drink alcohol or use illicit drugs.  ALLERGIES:  Allergies  Allergen Reactions  . Meperidine Hcl     REACTION: itching    ROS: A comprehensive review of systems was negative.  HOME MEDS: Current Outpatient Prescriptions  Medication Sig Dispense Refill  . amLODipine (NORVASC) 10 MG tablet Take 10 mg by mouth daily.    . hydrochlorothiazide (HYDRODIURIL) 25 MG tablet Take 0.5 tablets (12.5 mg total) by mouth daily. (Patient taking differently: Take 12.5 mg by mouth daily as needed. ) 45 tablet 3  . NON FORMULARY [place] in 8 to 10 ounces of water      No current facility-administered medications for this visit.    LABS/IMAGING: No results found for this or any previous visit (from the past 48 hour(s)). No results found.  WEIGHTS: Wt Readings from Last 3 Encounters:  02/23/15 213 lb 12.8 oz (96.979 kg)  02/16/15 208 lb 12 oz (94.688 kg)  01/28/15 213 lb (96.616 kg)    VITALS: BP 144/92 mmHg  Pulse 70  Ht 5' 10.5" (1.791 m)  Wt 213 lb 12.8 oz (96.979 kg)  BMI 30.23 kg/m2  EXAM: General appearance: alert and no distress  Neck: no carotid bruit, no JVD and thyroid not enlarged, symmetric, no tenderness/mass/nodules Lungs: clear to auscultation bilaterally Heart: regular rate and rhythm, S1, S2 normal, no murmur, click, rub or gallop Abdomen: soft, non-tender; bowel sounds normal; no masses,  no organomegaly Extremities: extremities normal, atraumatic, no cyanosis or edema Pulses: 2+ and symmetric Skin: Skin color, texture, turgor normal. No rashes or lesions Neurologic: Grossly normal Psych: Pleasant  EKG: Normal sinus rhythm at 70  ASSESSMENT: 1. Essential hypertension 2. Headaches  PLAN: 1.   David Villanueva is noted to have recent spikes in blood pressure and was not on medication. He had previously taken medication but was using it as  needed due to episodes of presyncope and low blood pressure in the past. He has since restarted amlodipine and low-dose HCTZ and blood pressure is now better controlled. I recommend he continue the current medications. Goal should be to keep his blood pressure on the high end of normal, about where it is now, which should give him some buffer room if his blood pressure were to drop. If he has problems with labile blood pressures, we may need to consider an alternative agent such as an ARB.  Plan to see him back in 6 months for follow-up. Thanks for the kind referral.  Pixie Casino, MD, [facility] Attending Cardiologist [place] 02/23/2015, 2:48 PM

## 2016-02-21 ENCOUNTER — Ambulatory Visit (INDEPENDENT_AMBULATORY_CARE_PROVIDER_SITE_OTHER): Payer: BLUE CROSS/BLUE SHIELD | Admitting: Family Medicine

## 2016-02-21 VITALS — BP 118/78 | HR 86 | Temp 97.9°F | Wt 219.4 lb

## 2016-02-21 DIAGNOSIS — I10 Essential (primary) hypertension: Secondary | ICD-10-CM

## 2016-02-21 LAB — BASIC METABOLIC PANEL
BUN: 20 mg/dL (ref 6–23)
CALCIUM: 10.2 mg/dL (ref 8.4–10.5)
CO2: 27 meq/L (ref 19–32)
Chloride: 100 mEq/L (ref 96–112)
Creatinine, Ser: 1.08 mg/dL (ref 0.40–1.50)
GFR: 73.38 mL/min (ref >60.00)
GLUCOSE: 118 mg/dL — AB (ref 70–99)
POTASSIUM: 3.7 meq/L (ref 3.5–5.1)
Sodium: 137 mEq/L (ref 135–145)

## 2016-02-21 NOTE — Progress Notes (Signed)
Pre visit review using our clinic review tool, if applicable. No additional management support is needed unless otherwise documented below in the visit note. 

## 2016-02-21 NOTE — Patient Instructions (Addendum)
Go to the lab on the way out.  We'll contact you with your lab report. 25mg  hctz a day, 10mg  amlodipine in the meantime.   Update me if BP is back up in the meantime (>140/>90). Take care.  Glad to see you.

## 2016-02-21 NOTE — Progress Notes (Signed)
Hypertension:    Using medication without problems or lightheadedness: he has been off/on meds for last few days.   Chest pain with exertion:no Edema:some at the end of the day, last night but that was after a long car trip.  Was bilateral.   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  He rested and recheck was still high, ~180.  He started checking his BP in the meantime.   Taking HCTZ 25mg  a day and amlodipine 20mg  a day.  Now BP improved, 140s/80s or lower recently.    Wife noted a knot on his penis recently.  Resolved in the meantime.  Only noted during sex.    Meds, vitals, and allergies reviewed.   ROS: Per HPI unless specifically indicated in ROS section   GEN: nad, alert and oriented HEENT: mucous membranes moist NECK: supple w/o LA CV: rrr. PULM: ctab, no inc wob ABD: soft, +bs EXT: no edema SKIN: no acute rash Genital exam wnl.  No mass noted.

## 2016-02-22 ENCOUNTER — Encounter: Payer: Self-pay | Admitting: Family Medicine

## 2016-02-22 NOTE — Assessment & Plan Note (Signed)
25mg  hctz a day, 10mg  amlodipine in the meantime.   Update me if BP is back up in the meantime (>140/>90). See notes on labs.  Okay for outpatient f/u.

## 2016-02-27 ENCOUNTER — Other Ambulatory Visit (INDEPENDENT_AMBULATORY_CARE_PROVIDER_SITE_OTHER): Payer: BLUE CROSS/BLUE SHIELD

## 2016-02-27 ENCOUNTER — Encounter: Payer: Self-pay | Admitting: Family Medicine

## 2016-02-27 ENCOUNTER — Ambulatory Visit: Payer: BLUE CROSS/BLUE SHIELD | Admitting: Family Medicine

## 2016-02-27 ENCOUNTER — Other Ambulatory Visit: Payer: Self-pay | Admitting: Family Medicine

## 2016-02-27 DIAGNOSIS — I10 Essential (primary) hypertension: Secondary | ICD-10-CM

## 2016-02-27 LAB — LIPID PANEL
CHOLESTEROL: 203 mg/dL — AB (ref 0–200)
HDL: 45.8 mg/dL (ref >39.00)
LDL Cholesterol: 127 mg/dL — ABNORMAL HIGH (ref 0–99)
NonHDL: 157.54
TRIGLYCERIDES: 155 mg/dL — AB (ref 0.0–149.0)
Total CHOL/HDL Ratio: 4
VLDL: 31 mg/dL (ref 0.0–40.0)

## 2016-02-27 LAB — COMPREHENSIVE METABOLIC PANEL
ALBUMIN: 4.4 g/dL (ref 3.5–5.2)
ALT: 25 U/L (ref 0–53)
AST: 16 U/L (ref 0–37)
Alkaline Phosphatase: 46 U/L (ref 39–117)
BILIRUBIN TOTAL: 0.6 mg/dL (ref 0.2–1.2)
BUN: 18 mg/dL (ref 6–23)
CALCIUM: 9.7 mg/dL (ref 8.4–10.5)
CHLORIDE: 103 meq/L (ref 96–112)
CO2: 31 meq/L (ref 19–32)
CREATININE: 1.21 mg/dL (ref 0.40–1.50)
GFR: 64.35 mL/min (ref >60.00)
Glucose, Bld: 104 mg/dL — ABNORMAL HIGH (ref 70–99)
Potassium: 4.3 mEq/L (ref 3.5–5.1)
Sodium: 139 mEq/L (ref 135–145)
Total Protein: 7.1 g/dL (ref 6.0–8.3)

## 2016-03-06 ENCOUNTER — Encounter: Payer: Self-pay | Admitting: Family Medicine

## 2016-03-06 ENCOUNTER — Ambulatory Visit (INDEPENDENT_AMBULATORY_CARE_PROVIDER_SITE_OTHER): Payer: BLUE CROSS/BLUE SHIELD | Admitting: Family Medicine

## 2016-03-06 VITALS — BP 122/74 | HR 71 | Temp 98.0°F | Ht 71.0 in | Wt 219.5 lb

## 2016-03-06 DIAGNOSIS — Z0000 Encounter for general adult medical examination without abnormal findings: Secondary | ICD-10-CM | POA: Diagnosis not present

## 2016-03-06 DIAGNOSIS — I10 Essential (primary) hypertension: Secondary | ICD-10-CM

## 2016-03-06 MED ORDER — HYDROCHLOROTHIAZIDE 25 MG PO TABS: 25.0000 mg | ORAL_TABLET | Freq: Every day | ORAL | 3 refills | Status: DC

## 2016-03-06 MED ORDER — AMLODIPINE BESYLATE 10 MG PO TABS: 10.0000 mg | ORAL_TABLET | Freq: Every day | ORAL | 3 refills | Status: DC

## 2016-03-06 NOTE — Patient Instructions (Signed)
If you get lightheaded, then cut the HCTZ in half, check your BP and update me.  Take care.  Glad to see you.

## 2016-03-06 NOTE — Progress Notes (Signed)
Pre visit review using our clinic review tool, if applicable. No additional management support is needed unless otherwise documented below in the visit note. 

## 2016-03-06 NOTE — Progress Notes (Signed)
CPE- See plan.  Routine anticipatory guidance given to patient.  See health maintenance. Tetanus 2016 Flu encouraged.  Declined.   PNA due at 67 Shingles 2014 Colonoscopy done in 2016. D/w pt.   Prostate cancer screening and PSA options (with potential risks and benefits of testing vs not testing) were discussed along with recent recs/guidelines.  He declined testing PSA at this point.  Living will - wife designated if patient were incapacitated.  Diet and exercise d/w pt.  Encouraged both.  [facility]     Hypertension:    Using medication without problems or lightheadedness: had been occ lightheaded but that is getting better in the meantime.   Chest pain with exertion:no Edema:no Short of breath:no  PMH and SH reviewed  Meds, vitals, and allergies reviewed.   ROS: Per HPI.  Unless specifically indicated otherwise in HPI, the patient denies:  General: fever. Eyes: acute vision changes ENT: sore throat Cardiovascular: chest pain Respiratory: SOB GI: vomiting GU: dysuria Musculoskeletal: acute back pain Derm: acute rash Neuro: acute motor dysfunction Psych: worsening mood Endocrine: polydipsia Heme: bleeding Allergy: hayfever  GEN: nad, alert and oriented HEENT: mucous membranes moist NECK: supple w/o LA CV: rrr. PULM: ctab, no inc wob ABD: soft, +bs EXT: no edema SKIN: no acute rash

## 2016-03-07 NOTE — Assessment & Plan Note (Signed)
Continue current medications. If lightheaded needs to cut his hydrochlorothiazide in half. Update me as needed. Okay for outpatient follow-up. Minimal increase in cholesterol in sugar, treatable with diet and exercise. Discussed with patient. He agrees.

## 2016-03-07 NOTE — Assessment & Plan Note (Signed)
Tetanus 2016 Flu encouraged.  Declined.   PNA due at 67 Shingles 2014 Colonoscopy done in 2016. D/w pt.   Prostate cancer screening and PSA options (with potential risks and benefits of testing vs not testing) were discussed along with recent recs/guidelines.  He declined testing PSA at this point.  Living will - wife designated if patient were incapacitated.  Diet and exercise d/w pt.  Encouraged both.  [facility].

## 2016-05-24 ENCOUNTER — Telehealth: Payer: Self-pay

## 2016-05-24 ENCOUNTER — Telehealth: Payer: Self-pay | Admitting: Family Medicine

## 2016-05-24 ENCOUNTER — Ambulatory Visit: Payer: BLUE CROSS/BLUE SHIELD | Admitting: Family Medicine

## 2016-05-24 NOTE — Telephone Encounter (Signed)
Appreciate help of all involved. I looked his blood pressure log. He has some reasonable blood pressures on home check, as low as 115/84. He has some mild elevations with systolics in the [id number]. With no chest pain or shortness of breath, there doesn't seem to be an urgent or emergent issue. He may have some cramps related to overuse of or thiazide. Cut back to 25 mg a day. Update me next week. We can go from there. Thanks.

## 2016-05-24 NOTE — Telephone Encounter (Signed)
Patient Name: David Villanueva (VERNON) Rosado  DOB: June 23, 1950    Initial Comment caller states his BP is elevated   Nurse Assessment  Nurse: Raphael Gibney, RN, Vanita Ingles Date/Time (Eastern Time): 05/24/2016 1:54:42 PM  Confirm and document reason for call. If symptomatic, describe symptoms. ---Caller states he is sitting in the office right now.  Does the patient have any new or worsening symptoms? ---Yes  Will a triage be completed? ---No  Select reason for no triage. ---Other  Please document clinical information provided and list any resource used. ---Pt states he is already at the office.     Guidelines    Guideline Title Affirmed Question Affirmed Notes       Final Disposition User   Clinical Call Stanley, RN, Vanita Ingles

## 2016-05-24 NOTE — Telephone Encounter (Signed)
Please see 05/24/16 phone note as well.

## 2016-05-24 NOTE — Telephone Encounter (Signed)
Dr Damita Dunnings reviewed BP readings and advised that pt could be mildly dehydrated by taking HCTZ 50 mg which could cause the feeling of weakness and possibly cause the achy cramping feeling in calves of legs as well. Pt should decrease HCTZ to 25 mg po daily and drink plenty of liquids and cb on 05/28/16 with update. Pt will also continue taking amlodipine 10 mg po daily. Pt voiced understanding. Pt will cb sooner if needed. Pt spoke with [facility] office manager about not being able to get thru on phone system this morning. FYI to Dr Damita Dunnings.

## 2016-05-24 NOTE — Telephone Encounter (Signed)
Pt left note at front desk that BP keeps on going up for over a week and meds are not working. Pt taking Amlodipine 10 mg one daily and HCTZ 25 mg taking one daily but on 05/18/16 pt started taking on his own HCTZ 50 mg. Pt last annual 03/06/16. Pt said at 12:20 BP was 154/99. I took BP x 2 and BP was 126/72. No CP/H/a, dizziness or SOB. Pt says he does not feel good; feels weak all over, achy in both calves of legs for one week or more. Pt works in stressful conditions but no particular stress noted.  Pt brought multiple BP readings that I placed in Dr Carole Civil in box.[facility].

## 2016-06-28 ENCOUNTER — Ambulatory Visit (INDEPENDENT_AMBULATORY_CARE_PROVIDER_SITE_OTHER): Payer: 59 | Admitting: Internal Medicine

## 2016-06-28 ENCOUNTER — Encounter: Payer: Self-pay | Admitting: Internal Medicine

## 2016-06-28 VITALS — BP 122/76 | HR 62 | Ht 70.0 in | Wt 217.8 lb

## 2016-06-28 DIAGNOSIS — I10 Essential (primary) hypertension: Secondary | ICD-10-CM | POA: Diagnosis not present

## 2016-06-28 NOTE — Patient Instructions (Signed)
Your physician wants you to follow-up in SIX MONTHS with Dr. Hilty. You will receive a reminder letter in the mail two months in advance. If you don't receive a letter, please call our office to schedule the follow-up appointment.  

## 2016-06-28 NOTE — Progress Notes (Signed)
OFFICE NOTE  Chief Complaint:  Labile blood pressure  Primary Care Physician: Elsie Stain, MD  HPI:  Rakin Lemelle is a 66 year old male who was previously seen by Dr. Gwenlyn Found, although this was more than 5 years ago. He has a history of hypertension in the past and has taken medication as needed, due to the fact that his blood pressure tends to go up and down. Recently he was not feeling well and had headaches. He was noted to have significant hypertension with blood pressure 118/112. He called his primary care provider who suggested he go back on his medications. It seems that the medications have made a significant difference in blood pressure now is improved today was 144/92. He denies any further headache. He's not had any chest pain or worsening shortness of breath. He remains physically active. 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 He generally sleeps well at night and had a sleepiness score of 3, therefore obstructive sleep apnea is not suspected.  06/28/2016  Mr. Burkland returns today for follow-up. Recently he has noted some labile blood pressures and was very concerned about some of his high blood pressures. He made an appointment to have them evaluated. Today in the office blood pressure was normal at 122/76. I reviewed a number blood pressure measurements that he had written down over the past 2-3 months. On average blood pressure was between 366 and 4:40 systolic with diastolics in the 34V to 42V. He does have some occasional systolic blood pressures in the 150s and some blood pressures that are systolic in the upper 95G. Is not clear that he is consistently symptomatic with either of these extremes. He occasionally is reporting some pain in his right lower leg. It was attributed to hypokalemia. He denies any chest pain, shortness of breath or worsening fatigue. In fact he said recently he was clearing trees on  his property and carrying large pieces of wood without difficulty. He is in a very stressful job. 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 Recently that office visit demonstrated his blood pressure was elevated however psych clear that they rechecked it to see if it came down. He also is a caffeine user drinking at least 2 cups coffee a day.  PMHx:  Past Medical History:  Diagnosis Date  . Headache(784.0)    prev history, extensive w/u with no clear diagnosis  . history of CT of pelvis 11/10/2001   normal  . Hyperlipidemia   . Hypertension   . Prostatitis 10/2001   Prostate abscess (Dr. Gaynelle Arabian)    Past Surgical History:  Procedure Laterality Date  . ACROMIO-CLAVICULAR JOINT REPAIR    . Carotid Doppler  07/2000   Negative, also MRA negative  . MRI//MRA Brain  2002   Negative  . TEMPORAL ARTERY BIOPSY / LIGATION  2002   Negative  . TONSILLECTOMY  1972  . VASECTOMY      FAMHx:  Family History  Problem Relation Age of Onset  . Heart disease Father     open heart surgery//  . Heart attack Father     multiple times  . Hypertension Paternal Grandmother     also hypertension   . Stroke Paternal Grandfather     also cancer  . Stroke Mother   . Colon cancer Maternal Aunt  dx'd late in life  . Colon cancer Maternal Grandfather   . Prostate cancer Neg Hx     SOCHx:   reports that he has never smoked. He has never used smokeless tobacco. He reports that he does not drink alcohol or use drugs.  ALLERGIES:  Allergies  Allergen Reactions  . Meperidine Hcl     REACTION: itching    ROS: A comprehensive review of systems was negative.  HOME MEDS: Current Outpatient Prescriptions  Medication Sig Dispense Refill  . amLODipine (NORVASC) 10 MG tablet Take 1 tablet (10 mg total) by mouth daily. 90 tablet 3  . hydrochlorothiazide (HYDRODIURIL) 25 MG tablet Take 1 tablet (25 mg total) by  mouth daily. 90 tablet 3  . NON FORMULARY [place] in 8 to 10 ounces of water      No current facility-administered medications for this visit.     LABS/IMAGING: No results found for this or any previous visit (from the past 48 hour(s)). No results found.  WEIGHTS: Wt Readings from Last 3 Encounters:  06/28/16 217 lb 12.8 oz (98.8 kg)  03/06/16 219 lb 8 oz (99.6 kg)  02/21/16 219 lb 6.4 oz (99.5 kg)    VITALS: BP 122/76   Pulse 62   Ht 5\' 10"  (1.778 m)   Wt 217 lb 12.8 oz (98.8 kg)   BMI 31.25 kg/m   EXAM: General appearance: alert and no distress Neck: no carotid bruit, no JVD and thyroid not enlarged, symmetric, no tenderness/mass/nodules Lungs: clear to auscultation bilaterally Heart: regular rate and rhythm, S1, S2 normal, no murmur, click, rub or gallop Abdomen: soft, non-tender; bowel sounds normal; no masses,  no organomegaly Extremities: extremities normal, atraumatic, no cyanosis or edema Pulses: 2+ and symmetric Skin: Skin color, texture, turgor normal. No rashes or lesions Neurologic: Grossly normal Psych: Pleasant  EKG: Normal sinus rhythm at 62  ASSESSMENT: 1. Essential hypertension  PLAN: 1.   Mr. Copenhaver has had some recent labile blood pressures which I suspect is situational. He is asymptomatic from a cardiac standpoint denying chest pain or worsening shortness of breath. In average his blood pressure control is asked a very good. Again I focused on reassurance and thought that he could benefit from a more exercise and weight loss. He could also consider decreasing caffeine intake perhaps by drinking one regular and one decaffeinated coffee a day. Stress management may also improve his symptoms. Plan to see him back in 6 months and he should keep a diary of blood pressures at home. If he needs to see me sooner happy to do that.  Pixie Casino, MD, [facility] Attending Cardiologist Mallory C Meridee Branum 06/28/2016, 10:54 AM

## 2016-09-05 ENCOUNTER — Ambulatory Visit (INDEPENDENT_AMBULATORY_CARE_PROVIDER_SITE_OTHER): Payer: 59 | Admitting: Family Medicine

## 2016-09-05 ENCOUNTER — Ambulatory Visit (INDEPENDENT_AMBULATORY_CARE_PROVIDER_SITE_OTHER): Payer: 59

## 2016-09-05 ENCOUNTER — Encounter: Payer: Self-pay | Admitting: Family Medicine

## 2016-09-05 VITALS — BP 140/94 | HR 66 | Temp 97.7°F | Wt 209.4 lb

## 2016-09-05 DIAGNOSIS — R059 Cough, unspecified: Secondary | ICD-10-CM

## 2016-09-05 DIAGNOSIS — J22 Unspecified acute lower respiratory infection: Secondary | ICD-10-CM

## 2016-09-05 DIAGNOSIS — R05 Cough: Secondary | ICD-10-CM

## 2016-09-05 DIAGNOSIS — R062 Wheezing: Secondary | ICD-10-CM

## 2016-09-05 LAB — CBC WITH DIFFERENTIAL/PLATELET
BASOS PCT: 0.4 % (ref 0.0–3.0)
Basophils Absolute: 0 10*3/uL (ref 0.0–0.1)
Eosinophils Absolute: 0.2 10*3/uL (ref 0.0–0.7)
Eosinophils Relative: 2.2 % (ref 0.0–5.0)
HEMATOCRIT: 47.9 % (ref 39.0–52.0)
Hemoglobin: 16.6 g/dL (ref 13.0–17.0)
LYMPHS ABS: 2.9 10*3/uL (ref 0.7–4.0)
LYMPHS PCT: 27.2 % (ref 12.0–46.0)
MCHC: 34.6 g/dL (ref 30.0–36.0)
MCV: 87.2 fl (ref 78.0–100.0)
MONOS PCT: 6.4 % (ref 3.0–12.0)
Monocytes Absolute: 0.7 10*3/uL (ref 0.1–1.0)
NEUTROS ABS: 6.7 10*3/uL (ref 1.4–7.7)
NEUTROS PCT: 63.8 % (ref 43.0–77.0)
PLATELETS: 257 10*3/uL (ref 150.0–400.0)
RBC: 5.49 Mil/uL (ref 4.22–5.81)
RDW: 13.6 % (ref 11.5–15.5)
WBC: 10.5 10*3/uL (ref 4.0–10.5)

## 2016-09-05 MED ORDER — IPRATROPIUM-ALBUTEROL 0.5-2.5 (3) MG/3ML IN SOLN
3.0000 mL | Freq: Once | RESPIRATORY_TRACT | Status: AC
  Administered 2016-09-05: 3 mL via RESPIRATORY_TRACT

## 2016-09-05 MED ORDER — AZITHROMYCIN 250 MG PO TABS: ORAL_TABLET | ORAL | 0 refills | Status: DC

## 2016-09-05 MED ORDER — PREDNISONE 20 MG PO TABS: ORAL_TABLET | ORAL | 0 refills | Status: DC

## 2016-09-05 NOTE — Patient Instructions (Signed)
Community-Acquired Pneumonia, Adult °Pneumonia is an infection of the lungs. There are different types of pneumonia. One type can develop while a person is in a hospital. A different type, called community-acquired pneumonia, develops in people who are not, or have not recently been, in the hospital or other health care facility. °What are the causes? °Pneumonia may be caused by bacteria, viruses, or funguses. Community-acquired pneumonia is often caused by Streptococcus pneumonia bacteria. These bacteria are often passed from one person to another by breathing in droplets from the cough or sneeze of an infected person. °What increases the risk? °The condition is more likely to develop in: °· People who have chronic diseases, such as chronic obstructive pulmonary disease (COPD), asthma, congestive heart failure, cystic fibrosis, diabetes, or kidney disease. °· People who have early-stage or late-stage HIV. °· People who have sickle cell disease. °· People who have had their spleen removed (splenectomy). °· People who have poor dental hygiene. °· People who have medical conditions that increase the risk of breathing in (aspirating) secretions their own mouth and nose. °· People who have a weakened immune system (immunocompromised). °· People who smoke. °· People who travel to areas where pneumonia-causing germs commonly exist. °· People who are around animal habitats or animals that have pneumonia-causing germs, including birds, bats, rabbits, cats, and farm animals. ° °What are the signs or symptoms? °Symptoms of this condition include: °· A dry cough. °· A wet (productive) cough. °· Fever. °· Sweating. °· Chest pain, especially when breathing deeply or coughing. °· Rapid breathing or difficulty breathing. °· Shortness of breath. °· Shaking chills. °· Fatigue. °· Muscle aches. ° °How is this diagnosed? °Your health care provider will take a medical history and perform a physical exam. You may also have other tests,  including: °· Imaging studies of your chest, including X-rays. °· Tests to check your blood oxygen level and other blood gases. °· Other tests on blood, mucus (sputum), fluid around your lungs (pleural fluid), and urine. ° °If your pneumonia is severe, other tests may be done to identify the specific cause of your illness. °How is this treated? °The type of treatment that you receive depends on many factors, such as the cause of your pneumonia, the medicines you take, and other medical conditions that you have. For most adults, treatment and recovery from pneumonia may occur at home. In some cases, treatment must happen in a hospital. Treatment may include: °· Antibiotic medicines, if the pneumonia was caused by bacteria. °· Antiviral medicines, if the pneumonia was caused by a virus. °· Medicines that are given by mouth or through an IV tube. °· Oxygen. °· Respiratory therapy. ° °Although rare, treating severe pneumonia may include: °· Mechanical ventilation. This is done if you are not breathing well on your own and you cannot maintain a safe blood oxygen level. °· Thoracentesis. This procedure removes fluid around one lung or both lungs to help you breathe better. ° °Follow these instructions at home: °· Take over-the-counter and prescription medicines only as told by your health care provider. °? Only take cough medicine if you are losing sleep. Understand that cough medicine can prevent your body’s natural ability to remove mucus from your lungs. °? If you were prescribed an antibiotic medicine, take it as told by your health care provider. Do not stop taking the antibiotic even if you start to feel better. °· Sleep in a semi-upright position at night. Try sleeping in a reclining chair, or place a few pillows under your head. °· Do not use tobacco products, including cigarettes, chewing   tobacco, and e-cigarettes. If you need help quitting, ask your health care provider. °· Drink enough water to keep your urine  clear or pale yellow. This will help to thin out mucus secretions in your lungs. °How is this prevented? °There are ways that you can decrease your risk of developing community-acquired pneumonia. Consider getting a pneumococcal vaccine if: °· You are older than 65 years of age. °· You are older than 64 years of age and are undergoing cancer treatment, have chronic lung disease, or have other medical conditions that affect your immune system. Ask your health care provider if this applies to you. ° °There are different types and schedules of pneumococcal vaccines. Ask your health care provider which vaccination option is best for you. °You may also prevent community-acquired pneumonia if you take these actions: °· Get an influenza vaccine every year. Ask your health care provider which type of influenza vaccine is best for you. °· Go to the dentist on a regular basis. °· Wash your hands often. Use hand sanitizer if soap and water are not available. ° °Contact a health care provider if: °· You have a fever. °· You are losing sleep because you cannot control your cough with cough medicine. °Get help right away if: °· You have worsening shortness of breath. °· You have increased chest pain. °· Your sickness becomes worse, especially if you are an older adult or have a weakened immune system. °· You cough up blood. °This information is not intended to replace advice given to you by your health care provider. Make sure you discuss any questions you have with your health care provider. °Document Released: 04/09/2005 Document Revised: 08/18/2015 Document Reviewed: 08/04/2014 °Elsevier Interactive Patient Education © 2017 [employer] Inc. ° °

## 2016-09-05 NOTE — Progress Notes (Signed)
Subjective:    Patient ID: David Villanueva, male    DOB: [birth year], 66 y.o.   MRN: [id number]  HPI This is a 66 yo patient of Dr Damita Dunnings who presents today with cough x 5-7 days. Has spring time allergies, but this feels different. Deep cough with little sputum. Took Mucinex with some relief of congested feeling. No fever. Hearing some wheezing and feels SOB with activity. Cough worse at night, some nasal drainage. No headache, no ear pain, no sore throat, no sick contacts. No asthma history, no smoking history.  Feels the same today as yesterday. Out of work for 3 days- very unusual for him.   Past Medical History:  Diagnosis Date  . Headache(784.0)    prev history, extensive w/u with no clear diagnosis  . history of CT of pelvis 11/10/2001   normal  . Hyperlipidemia   . Hypertension   . Prostatitis 10/2001   Prostate abscess (Dr. Gaynelle Arabian)   Past Surgical History:  Procedure Laterality Date  . ACROMIO-CLAVICULAR JOINT REPAIR    . Carotid Doppler  07/2000   Negative, also MRA negative  . MRI//MRA Brain  2002   Negative  . TEMPORAL ARTERY BIOPSY / LIGATION  2002   Negative  . TONSILLECTOMY  1972  . VASECTOMY     Family History  Problem Relation Age of Onset  . Heart disease Father        open heart surgery//  . Heart attack Father        multiple times  . Hypertension Paternal Grandmother        also hypertension   . Stroke Paternal Grandfather        also cancer  . Stroke Mother   . Colon cancer Maternal Aunt        dx'd late in life  . Colon cancer Maternal Grandfather   . Prostate cancer Neg Hx    Social History  Substance Use Topics  . Smoking status: Never Smoker  . Smokeless tobacco: Never Used  . Alcohol use No      Review of Systems Per HPI    Objective:   Physical Exam  Constitutional: He is oriented to person, place, and time. He appears well-developed and well-nourished. No distress.  HENT:  Head: Normocephalic and atraumatic.  Right Ear:  External ear normal.  Left Ear: External ear normal.  Nose: Nose normal.  Mouth/Throat: Oropharynx is clear and moist. No oropharyngeal exudate.  Eyes: Conjunctivae are normal.  Neck: Normal range of motion. Neck supple.  Cardiovascular: Normal rate, regular rhythm and normal heart sounds.   Pulmonary/Chest: Effort normal. No respiratory distress. He has wheezes.  Scattered wheezes posteriorly, do not clear with cough.  Given albuterol/atrovent nebulizer treatment in office without significant subjective or objective improvement.   Lymphadenopathy:    He has no cervical adenopathy.  Neurological: He is alert and oriented to person, place, and time.  Skin: Skin is warm and dry. He is not diaphoretic.  Psychiatric: He has a normal mood and affect. His behavior is normal. Judgment and thought content normal.  Vitals reviewed.     BP (!) 140/94 (BP Location: Right Arm, Patient Position: Sitting, Cuff Size: Normal)   Pulse 66   Temp 97.7 F (36.5 C) (Oral)   Wt 209 lb 6.4 oz (95 kg)   SpO2 96%   BMI 30.05 kg/m  Wt Readings from Last 3 Encounters:  09/05/16 209 lb 6.4 oz (95 kg)  06/28/16 217 lb 12.8 oz (98.8  kg)  03/06/16 219 lb 8 oz (99.6 kg)   BP Readings from Last 3 Encounters:  09/05/16 (!) 140/94  06/28/16 122/76  03/06/16 122/74       Assessment & Plan:  1. Cough - ipratropium-albuterol (DUONEB) 0.5-2.5 (3) MG/3ML nebulizer solution 3 mL; Take 3 mLs by nebulization once. - CBC with Differential/Platelet - DG Chest 2 View; Future  2. Wheeze - ipratropium-albuterol (DUONEB) 0.5-2.5 (3) MG/3ML nebulizer solution 3 mL; Take 3 mLs by nebulization once. - CBC with Differential/Platelet - DG Chest 2 View; Future  3. Lower respiratory infection - Provided written and verbal information regarding diagnosis and treatment. - RTC/ER precautions reviewed - rest, increase fluids - azithromycin (ZITHROMAX) 250 MG tablet; Take 2 tabs PO x 1 dose, then 1 tab PO QD x 4 days   Dispense: 6 tablet; Refill: 0 - predniSONE (DELTASONE) 20 MG tablet; Take 3 tabs x 3 days, 2 x 3 days, 1 x 3 days  Dispense: 18 tablet; Refill: 0  Clarene Reamer, FNP-BC  [facility]  09/05/2016 12:13 PM

## 2016-09-10 ENCOUNTER — Encounter: Payer: Self-pay | Admitting: Family Medicine

## 2016-09-10 ENCOUNTER — Ambulatory Visit (INDEPENDENT_AMBULATORY_CARE_PROVIDER_SITE_OTHER): Payer: 59 | Admitting: Family Medicine

## 2016-09-10 DIAGNOSIS — R05 Cough: Secondary | ICD-10-CM | POA: Diagnosis not present

## 2016-09-10 DIAGNOSIS — R059 Cough, unspecified: Secondary | ICD-10-CM

## 2016-09-10 NOTE — Progress Notes (Signed)
Seen recently with cough.  Started on prednisone and done with zmax.  Done with abx now.    In the meantime, he feels weaker but that is partly related to relative deconditioning.  He doesn't have good exercise tolerance, he can get a little SOB with exertion.  Some sputum.  He feels some better than he did last week.  The cough is some better in the meantime.  No recent fevers, not in the last few days.    D/w pt about his work situation.  He can work from home some.    Meds, vitals, and allergies reviewed.   ROS: Per HPI unless specifically indicated in ROS section   GEN: nad, alert and oriented HEENT: mucous membranes moist, tm w/o erythema, nasal exam w/o erythema, clear discharge noted,  OP with cobblestoning NECK: supple w/o LA CV: rrr.   PULM: ctab after a cough- he had some exp rhonchi prior to coughing, no inc wob EXT: no edema SKIN: no acute rash ------------------------------------------------------------------------------------------ Imaging reviewed with patient at the [facility].   Prev imaging with-  IMPRESSION: There is no pneumonia nor other acute cardiopulmonary abnormality. Mild interstitial prominence bilaterally may reflect acute or chronic bronchitic change.

## 2016-09-10 NOTE — Assessment & Plan Note (Signed)
Likely improved, but still with some rhonchi that clear with a cough.  No sign of PNA at this point, still okay for outpatient f/u.  Continue pred taper and update [place] as needed.  Work note given.  He agrees.

## 2016-09-10 NOTE — Patient Instructions (Signed)
Finish the prednisone, with food.  Rest and fluids in the meantime.  Update me if worse or if a new work note is needed.   You should gradually get better but the dry cough will likely be the last symptom to resolve.  Take care.  Glad to see you.

## 2016-12-25 ENCOUNTER — Encounter: Payer: Self-pay | Admitting: Internal Medicine

## 2016-12-25 ENCOUNTER — Ambulatory Visit (INDEPENDENT_AMBULATORY_CARE_PROVIDER_SITE_OTHER): Payer: BLUE CROSS/BLUE SHIELD | Admitting: Internal Medicine

## 2016-12-25 VITALS — BP 129/82 | HR 62 | Ht 70.5 in | Wt 211.4 lb

## 2016-12-25 DIAGNOSIS — I10 Essential (primary) hypertension: Secondary | ICD-10-CM

## 2016-12-25 NOTE — Patient Instructions (Signed)
Medication Instructions:  Your physician recommends that you continue on your current medications as directed. Please refer to the Current Medication list given to you today.  Labwork: NONE  Testing/Procedures: NONE   Follow-Up: YOUR PHYSICIAN RECOMMENDS THAT YOU FOLLOW UP AS NEEDED  Any Other Special Instructions Will Be Listed Below (If Applicable).  If you need a refill on your cardiac medications before your next appointment, please call your pharmacy.

## 2016-12-25 NOTE — Progress Notes (Signed)
OFFICE NOTE  Chief Complaint:  Follow-up hypertension  Primary Care Physician: David Ghent, MD  HPI:  David Villanueva is a 66 year old male who was previously seen by David Villanueva, although this was more than 5 years ago. He has a history of hypertension in the past and has taken medication as needed, due to the fact that his blood pressure tends to go up and down. Recently he was not feeling well and had headaches. He was noted to have significant hypertension with blood pressure 118/112. He called his primary care provider who suggested he go back on his medications. It seems that the medications have made a significant difference in blood pressure now is improved today was 144/92. He denies any further headache. He's not had any chest pain or worsening shortness of breath. He remains physically active. 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 He generally sleeps well at night and had a sleepiness score of 3, therefore obstructive sleep apnea is not suspected.  06/28/2016  David Villanueva returns today for follow-up. Recently he has noted some labile blood pressures and was very concerned about some of his high blood pressures. He made an appointment to have them evaluated. Today in the office blood pressure was normal at 122/76. I reviewed a number blood pressure measurements that he had written down over the past 2-3 months. On average blood pressure was between 390 and 3:00 systolic with diastolics in the 92Z to 30Q. He does have some occasional systolic blood pressures in the 150s and some blood pressures that are systolic in the upper 76A. Is not clear that he is consistently symptomatic with either of these extremes. He occasionally is reporting some pain in his right lower leg. It was attributed to hypokalemia. He denies any chest pain, shortness of breath or worsening fatigue. In fact he said recently he was clearing trees  on his property and carrying large pieces of wood without difficulty. He is in a very stressful job. 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 Recently that office visit demonstrated his blood pressure was elevated however psych clear that they rechecked it to see if it came down. He also is a caffeine user drinking at least 2 cups coffee a day.  12/25/2016  David Villanueva was seen back today. He seems to be doing very well. He says his blood pressure has been stable. He denies any chest pain or worsening shortness of breath. 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   PMHx:  Past Medical History:  Diagnosis Date  . Headache(784.0)    prev history, extensive w/u with no clear diagnosis  . history of CT of pelvis 11/10/2001   normal  . Hyperlipidemia   . Hypertension   . Prostatitis 10/2001   Prostate abscess (Dr. Gaynelle Arabian)    Past Surgical History:  Procedure Laterality Date  . ACROMIO-CLAVICULAR JOINT REPAIR    . Carotid Doppler  07/2000   Negative, also MRA negative  . MRI//MRA Brain  2002   Negative  . TEMPORAL ARTERY BIOPSY / LIGATION  2002   Negative  . TONSILLECTOMY  1972  . VASECTOMY      FAMHx:  Family History  Problem Relation Age of Onset  . Heart disease Father  open heart surgery//  . Heart attack Father        multiple times  . Hypertension Paternal Grandmother        also hypertension   . Stroke Paternal Grandfather        also cancer  . Stroke Mother   . Colon cancer Maternal Aunt        dx'd late in life  . Colon cancer Maternal Grandfather   . Prostate cancer Neg Hx     SOCHx:   reports that he has never smoked. He has never used smokeless tobacco. He reports that he does not drink alcohol or use drugs.  ALLERGIES:  Allergies  Allergen Reactions  . Meperidine Hcl     REACTION: itching    ROS: A comprehensive review of systems was  negative.  HOME MEDS: Current Outpatient Prescriptions  Medication Sig Dispense Refill  . amLODipine (NORVASC) 10 MG tablet Take 1 tablet (10 mg total) by mouth daily. 90 tablet 3  . hydrochlorothiazide (HYDRODIURIL) 25 MG tablet Take 1 tablet (25 mg total) by mouth daily. 90 tablet 3  . NON FORMULARY [place] in 8 to 10 ounces of water      No current facility-administered medications for this visit.     LABS/IMAGING: No results Villanueva for this or any previous visit (from the past 48 hour(s)). No results Villanueva.  WEIGHTS: Wt Readings from Last 3 Encounters:  12/25/16 211 lb 6.4 oz (95.9 kg)  09/10/16 209 lb 8 oz (95 kg)  09/05/16 209 lb 6.4 oz (95 kg)    VITALS: BP 129/82 (BP Location: Right Arm)   Pulse 62   Ht 5' 10.5" (1.791 m)   Wt 211 lb 6.4 oz (95.9 kg)   BMI 29.90 kg/m   EXAM: General appearance: alert and no distress Neck: no carotid bruit, no JVD and thyroid not enlarged, symmetric, no tenderness/mass/nodules Lungs: clear to auscultation bilaterally Heart: regular rate and rhythm, S1, S2 normal, no murmur, click, rub or gallop Abdomen: soft, non-tender; bowel sounds normal; no masses,  no organomegaly Extremities: extremities normal, atraumatic, no cyanosis or edema Pulses: 2+ and symmetric Skin: Skin color, texture, turgor normal. No rashes or lesions Neurologic: Grossly normal Psych: Pleasant  EKG: Normal sinus rhythm at 62-personally reviewed  ASSESSMENT: 1. Essential hypertension  PLAN: 1.   David Villanueva has had stabilization of his blood pressures. Seems to be very steady on his current regimen of medicines. Otherwise he seems to have no new symptoms. He's physically active and denies any worsening shortness of breath, chest pain or significant fatigue. Follow-up with me can be on an as-needed basis.  Pixie Casino, MD, [facility] Attending Cardiologist [place] 12/25/2016, 4:18 PM

## 2017-03-06 ENCOUNTER — Other Ambulatory Visit: Payer: BLUE CROSS/BLUE SHIELD

## 2017-03-06 ENCOUNTER — Other Ambulatory Visit: Payer: Self-pay | Admitting: Family Medicine

## 2017-03-06 DIAGNOSIS — I10 Essential (primary) hypertension: Secondary | ICD-10-CM

## 2017-03-08 ENCOUNTER — Encounter: Payer: BLUE CROSS/BLUE SHIELD | Admitting: Family Medicine

## 2017-03-17 ENCOUNTER — Other Ambulatory Visit: Payer: Self-pay | Admitting: Family Medicine

## 2017-04-08 ENCOUNTER — Other Ambulatory Visit (INDEPENDENT_AMBULATORY_CARE_PROVIDER_SITE_OTHER): Payer: BLUE CROSS/BLUE SHIELD

## 2017-04-08 DIAGNOSIS — I10 Essential (primary) hypertension: Secondary | ICD-10-CM | POA: Diagnosis not present

## 2017-04-08 LAB — LIPID PANEL
CHOL/HDL RATIO: 4
Cholesterol: 182 mg/dL (ref 0–200)
HDL: 44.1 mg/dL (ref >39.00)
LDL Cholesterol: 111 mg/dL — ABNORMAL HIGH (ref 0–99)
NONHDL: 137.47
TRIGLYCERIDES: 130 mg/dL (ref 0.0–149.0)
VLDL: 26 mg/dL (ref 0.0–40.0)

## 2017-04-08 LAB — COMPREHENSIVE METABOLIC PANEL
ALK PHOS: 43 U/L (ref 39–117)
ALT: 22 U/L (ref 0–53)
AST: 16 U/L (ref 0–37)
Albumin: 4.3 g/dL (ref 3.5–5.2)
BUN: 23 mg/dL (ref 6–23)
CALCIUM: 9.5 mg/dL (ref 8.4–10.5)
CO2: 32 meq/L (ref 19–32)
Chloride: 101 mEq/L (ref 96–112)
Creatinine, Ser: 1.24 mg/dL (ref 0.40–1.50)
GFR: 62.34 mL/min (ref >60.00)
Glucose, Bld: 103 mg/dL — ABNORMAL HIGH (ref 70–99)
POTASSIUM: 4.2 meq/L (ref 3.5–5.1)
Sodium: 139 mEq/L (ref 135–145)
TOTAL PROTEIN: 7.2 g/dL (ref 6.0–8.3)
Total Bilirubin: 0.8 mg/dL (ref 0.2–1.2)

## 2017-04-09 ENCOUNTER — Encounter: Payer: Self-pay | Admitting: Family Medicine

## 2017-04-09 ENCOUNTER — Ambulatory Visit (INDEPENDENT_AMBULATORY_CARE_PROVIDER_SITE_OTHER): Payer: BLUE CROSS/BLUE SHIELD | Admitting: Family Medicine

## 2017-04-09 VITALS — BP 130/76 | HR 63 | Temp 97.8°F | Ht 71.0 in | Wt 218.8 lb

## 2017-04-09 DIAGNOSIS — Z0000 Encounter for general adult medical examination without abnormal findings: Secondary | ICD-10-CM

## 2017-04-09 DIAGNOSIS — I10 Essential (primary) hypertension: Secondary | ICD-10-CM

## 2017-04-09 MED ORDER — AMLODIPINE BESYLATE 10 MG PO TABS: 10.0000 mg | ORAL_TABLET | Freq: Every day | ORAL | 3 refills | Status: DC

## 2017-04-09 MED ORDER — HYDROCHLOROTHIAZIDE 25 MG PO TABS: 25.0000 mg | ORAL_TABLET | Freq: Every day | ORAL | 3 refills | Status: DC

## 2017-04-09 NOTE — Patient Instructions (Signed)
Update me as needed.  Keep working on diet and exercise.   I would get a flu shot each fall.   Don't change your meds for now.  We can set you up with general surgery if needed about the umbilical hernia.

## 2017-04-09 NOTE — Progress Notes (Signed)
CPE- See plan.  Routine anticipatory guidance given to patient.  See health maintenance.  The possibility exists that previously documented standard health maintenance information may have been brought forward from a previous encounter into this note.  If needed, that same information has been updated to reflect the current situation based on today's encounter.    Tetanus 2016 Flu encouraged. "I'll think about it." PNA due at 67 Shingles 2014 Colonoscopy done in 2016 Prostate cancer screening and PSA options(with potential risks and benefits of testing vs not testing) were discussed along with recent recs/guidelines. He declined testing PSAat this point.  Living will - wife designated if patient were incapacitated.   Diet and exercise d/w pt.  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   [facility]    His father's heart disease was likely related to valve damage from a prev illness and less likely to be inheritable, dw pt.    Hypertension:    Using medication without problems or lightheadedness: yes Chest pain with exertion:no Edema:no Short of breath:no Labs d/w pt.    PMH and SH reviewed  Meds, vitals, and allergies reviewed.   ROS: Per HPI.  Unless specifically indicated otherwise in HPI, the patient denies:  General: fever. Eyes: acute vision changes ENT: sore throat Cardiovascular: chest pain Respiratory: SOB GI: vomiting GU: dysuria Musculoskeletal: acute back pain Derm: acute rash Neuro: acute motor dysfunction Psych: worsening mood Endocrine: polydipsia Heme: bleeding Allergy: hayfever  GEN: nad, alert and oriented HEENT: mucous membranes moist NECK: supple w/o LA CV: rrr. PULM: ctab, no inc wob ABD: soft, +bs, soft umbilical hernia noted- not bothersome or tender.   EXT: no edema SKIN: no acute rash

## 2017-04-10 NOTE — Assessment & Plan Note (Signed)
Labs discussed with patient.  Continue current medications.  No adverse effect.  Continue work on diet and exercise.

## 2017-04-10 NOTE — Assessment & Plan Note (Signed)
Tetanus 2016 Flu encouraged. "I'll think about it."  He has no reason to avoid vaccination.  I told him it makes no sense to come in for routine physical exam, get good medical advice, and then ignore the advice. PNA due at 67 Shingles 2014 Colonoscopy done in 2016 Prostate cancer screening and PSA options(with potential risks and benefits of testing vs not testing) were discussed along with recent recs/guidelines. He declined testing PSAat this point.  Living will - wife designated if patient were incapacitated.   Diet and exercise d/w pt.  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   [facility].

## 2018-02-20 ENCOUNTER — Encounter: Payer: Self-pay | Admitting: Family Medicine

## 2018-02-20 DIAGNOSIS — B9681 Helicobacter pylori [H. pylori] as the cause of diseases classified elsewhere: Secondary | ICD-10-CM | POA: Diagnosis not present

## 2018-02-20 DIAGNOSIS — K8080 Other cholelithiasis without obstruction: Secondary | ICD-10-CM | POA: Diagnosis not present

## 2018-02-20 DIAGNOSIS — R1013 Epigastric pain: Secondary | ICD-10-CM | POA: Diagnosis not present

## 2018-03-03 ENCOUNTER — Encounter: Payer: Self-pay | Admitting: Family Medicine

## 2018-03-03 ENCOUNTER — Telehealth: Payer: Self-pay | Admitting: Family Medicine

## 2018-03-03 DIAGNOSIS — K8020 Calculus of gallbladder without cholecystitis without obstruction: Secondary | ICD-10-CM | POA: Insufficient documentation

## 2018-03-03 DIAGNOSIS — R768 Other specified abnormal immunological findings in serum: Secondary | ICD-10-CM | POA: Insufficient documentation

## 2018-03-03 NOTE — Telephone Encounter (Signed)
Notify patient.  Outside records reviewed.  H. pylori positive.  If he wants to come in and talk about treatment then please offer office visit.  Gallstones noted on ultrasound.  If he wants to be referred to general surgery then let me know and I will put in the referral.  Thanks.

## 2018-03-03 NOTE — Telephone Encounter (Signed)
Noted.  I'll defer to them. Thanks.

## 2018-03-03 NOTE — Telephone Encounter (Signed)
Wife advised and states that the patient is in [place] and won't be home till the Tuesday before Thanksgiving and has an appointment at that time.  Wife states that when she was last diagnosed with H. Pylori, she discussed it with her GI MD and she took [place] and it worked for her so she has shipped her husband some [place] to use.  Wife says that the patient stated within 30 minutes after getting to the hospital in [place], he felt much better.  Nonetheless, patient has appointment scheduled with Dr. Damita Dunnings.

## 2018-03-18 ENCOUNTER — Ambulatory Visit (INDEPENDENT_AMBULATORY_CARE_PROVIDER_SITE_OTHER): Payer: Medicare Other | Admitting: Family Medicine

## 2018-03-18 ENCOUNTER — Encounter: Payer: Self-pay | Admitting: Family Medicine

## 2018-03-18 VITALS — BP 108/70 | HR 72 | Temp 98.3°F | Ht 71.0 in | Wt 219.5 lb

## 2018-03-18 DIAGNOSIS — R768 Other specified abnormal immunological findings in serum: Secondary | ICD-10-CM | POA: Diagnosis not present

## 2018-03-18 DIAGNOSIS — K8020 Calculus of gallbladder without cholecystitis without obstruction: Secondary | ICD-10-CM

## 2018-03-18 NOTE — Progress Notes (Signed)
He had abd pain and nausea and went to an outside hospital. He got better at the ER, declined further w/u at that point.   Records reviewed.  Here for follow-up today.   He had H pylori testing that positive- that was a serum test.  Had gallstones noted.  He prev had epigastric pain prev.  He isn't on PPI.  We talked about stool testing for H pylori.    In the meantime, no abdominal pain.  No vomiting.  No diarrhea.  No blood in stool.  He feels well otherwise.  Meds, vitals, and allergies reviewed.   ROS: Per HPI unless specifically indicated in ROS section   GEN: nad, alert and oriented HEENT: mucous membranes moist NECK: supple w/o LA CV: rrr PULM: ctab, no inc wob ABD: soft, +bs EXT: no edema SKIN: no acute rash Umbilical hernia noted, soft.  Nontender.

## 2018-03-18 NOTE — Patient Instructions (Signed)
Go to the lab on the way out. Pick up the stool kit.  If you have more troubles, we can set you up with the general surgery clinic about the gallstones.  Avoid fatty foods.  Take care.  Glad to see you.

## 2018-03-19 ENCOUNTER — Other Ambulatory Visit: Payer: Medicare Other

## 2018-03-19 DIAGNOSIS — R768 Other specified abnormal immunological findings in serum: Secondary | ICD-10-CM

## 2018-03-21 LAB — HELICOBACTER PYLORI  SPECIAL ANTIGEN
MICRO NUMBER: 91430938
SPECIMEN QUALITY: ADEQUATE

## 2018-03-22 ENCOUNTER — Encounter: Payer: Self-pay | Admitting: Family Medicine

## 2018-03-22 NOTE — Assessment & Plan Note (Signed)
Unclear if he truly had H. pylori that was contributing to his symptoms at that time.  He has not been on a PPI.  Discussed stool antigen testing.  See notes on labs.  It may be that he had previous H. pylori in the past but does not have it currently.

## 2018-03-22 NOTE — Assessment & Plan Note (Signed)
Incidentally noted.  No symptoms currently.  Discussed referral to general surgery.  He can consider.  Encouraged, but he wanted to defer for now.  Routine cautions given.  Discussed gallstone pathophysiology.

## 2018-03-31 ENCOUNTER — Other Ambulatory Visit: Payer: Self-pay | Admitting: Family Medicine

## 2018-04-08 ENCOUNTER — Other Ambulatory Visit: Payer: BLUE CROSS/BLUE SHIELD

## 2018-04-10 ENCOUNTER — Encounter: Payer: BLUE CROSS/BLUE SHIELD | Admitting: Family Medicine

## 2018-04-11 ENCOUNTER — Encounter: Payer: BLUE CROSS/BLUE SHIELD | Admitting: Family Medicine

## 2018-05-27 DIAGNOSIS — L738 Other specified follicular disorders: Secondary | ICD-10-CM | POA: Diagnosis not present

## 2018-05-27 DIAGNOSIS — D485 Neoplasm of uncertain behavior of skin: Secondary | ICD-10-CM | POA: Diagnosis not present

## 2018-05-27 DIAGNOSIS — L812 Freckles: Secondary | ICD-10-CM | POA: Diagnosis not present

## 2018-05-27 DIAGNOSIS — D1801 Hemangioma of skin and subcutaneous tissue: Secondary | ICD-10-CM | POA: Diagnosis not present

## 2018-05-27 DIAGNOSIS — C44519 Basal cell carcinoma of skin of other part of trunk: Secondary | ICD-10-CM | POA: Diagnosis not present

## 2018-05-27 DIAGNOSIS — L821 Other seborrheic keratosis: Secondary | ICD-10-CM | POA: Diagnosis not present

## 2018-05-27 DIAGNOSIS — B078 Other viral warts: Secondary | ICD-10-CM | POA: Diagnosis not present

## 2018-06-17 ENCOUNTER — Other Ambulatory Visit: Payer: BLUE CROSS/BLUE SHIELD

## 2018-06-24 ENCOUNTER — Encounter: Payer: BLUE CROSS/BLUE SHIELD | Admitting: Family Medicine

## 2018-07-31 ENCOUNTER — Encounter: Payer: BLUE CROSS/BLUE SHIELD | Admitting: Family Medicine

## 2018-10-02 ENCOUNTER — Encounter: Payer: BLUE CROSS/BLUE SHIELD | Admitting: Family Medicine

## 2018-10-13 ENCOUNTER — Other Ambulatory Visit: Payer: Self-pay | Admitting: *Deleted

## 2018-10-13 ENCOUNTER — Telehealth: Payer: Self-pay | Admitting: Family Medicine

## 2018-10-13 MED ORDER — HYDROCHLOROTHIAZIDE 25 MG PO TABS: 25.0000 mg | ORAL_TABLET | Freq: Every day | ORAL | 1 refills | Status: DC

## 2018-10-13 MED ORDER — AMLODIPINE BESYLATE 10 MG PO TABS: 10.0000 mg | ORAL_TABLET | Freq: Every day | ORAL | 1 refills | Status: DC

## 2018-10-13 NOTE — Telephone Encounter (Signed)
Refills sent to pharmacy as requested.

## 2018-10-13 NOTE — Telephone Encounter (Signed)
Best number [phone] Spouse (lynn) called stating drug store has not received information back on rx   Pt is out of his medications  Amlodipine Hydrochlorothiazide  [facility] [place] [facility] Phone [phone] Fax      [phone]

## 2018-10-19 DIAGNOSIS — K8020 Calculus of gallbladder without cholecystitis without obstruction: Secondary | ICD-10-CM | POA: Diagnosis not present

## 2018-10-19 DIAGNOSIS — K8070 Calculus of gallbladder and bile duct without cholecystitis without obstruction: Secondary | ICD-10-CM | POA: Diagnosis not present

## 2018-10-19 DIAGNOSIS — R1011 Right upper quadrant pain: Secondary | ICD-10-CM | POA: Diagnosis not present

## 2018-11-16 ENCOUNTER — Encounter: Payer: Self-pay | Admitting: Family Medicine

## 2018-11-16 ENCOUNTER — Telehealth: Payer: Self-pay | Admitting: Family Medicine

## 2018-11-16 DIAGNOSIS — K760 Fatty (change of) liver, not elsewhere classified: Secondary | ICD-10-CM | POA: Insufficient documentation

## 2018-11-16 NOTE — Telephone Encounter (Signed)
Call patient.  Outside results reviewed.  Ultrasound with gallstones but no sign of cholecystitis.  Fatty deposits in the liver noted.  Also has complex cystic structures in the liver most likely to be cysts.  However, nonemergent MRI liver protocol recommended to better characterize these lesions and exclude the possibility of more significant problems.  If he is willing to go for the MRI then I will put in the order.  I do not know if he is back in [place] yet.  I also do not know if he has had a follow-up MRI elsewhere in the meantime.  Please let me know.  Thanks.

## 2018-11-17 NOTE — Telephone Encounter (Signed)
Left detailed message on voicemail.  

## 2018-11-18 ENCOUNTER — Telehealth: Payer: Self-pay | Admitting: Family Medicine

## 2018-11-18 NOTE — Telephone Encounter (Signed)
Patient's wife Jeani Hawking called today in regards to patient's appt next week for his physical. He is having some shoulder pains but was told before that they were unable to discuss/address any concerns during a physical appointment because then the insurance would not cover the physical.   Patient's wife would like to know if they should schedule a separate visit to have the shoulder pain addressed?  C/B # [phone]

## 2018-11-18 NOTE — Telephone Encounter (Signed)
I usually write it up as a physical and try to work on the other issues if possible at the same time. That usually covers it.

## 2018-11-18 NOTE — Telephone Encounter (Signed)
Wife advised. 

## 2018-11-21 NOTE — Telephone Encounter (Signed)
Patient wife called and said patient wants to wait until his cpx to discuss with Dr.Duncan about getting an MRI.

## 2018-11-21 NOTE — Telephone Encounter (Signed)
Noted. Thanks.  I'll defer until then.

## 2018-11-25 ENCOUNTER — Other Ambulatory Visit: Payer: Self-pay | Admitting: Family Medicine

## 2018-11-25 ENCOUNTER — Other Ambulatory Visit: Payer: Self-pay

## 2018-11-25 ENCOUNTER — Other Ambulatory Visit (INDEPENDENT_AMBULATORY_CARE_PROVIDER_SITE_OTHER): Payer: Medicare Other

## 2018-11-25 DIAGNOSIS — K760 Fatty (change of) liver, not elsewhere classified: Secondary | ICD-10-CM

## 2018-11-25 DIAGNOSIS — Z125 Encounter for screening for malignant neoplasm of prostate: Secondary | ICD-10-CM

## 2018-11-25 LAB — COMPREHENSIVE METABOLIC PANEL
ALT: 14 U/L (ref 0–53)
AST: 12 U/L (ref 0–37)
Albumin: 4.4 g/dL (ref 3.5–5.2)
Alkaline Phosphatase: 57 U/L (ref 39–117)
BUN: 13 mg/dL (ref 6–23)
CO2: 33 mEq/L — ABNORMAL HIGH (ref 19–32)
Calcium: 9.8 mg/dL (ref 8.4–10.5)
Chloride: 102 mEq/L (ref 96–112)
Creatinine, Ser: 1.1 mg/dL (ref 0.40–1.50)
GFR: 67.01 mL/min (ref >60.00)
Glucose, Bld: 93 mg/dL (ref 70–99)
Potassium: 4.5 mEq/L (ref 3.5–5.1)
Sodium: 140 mEq/L (ref 135–145)
Total Bilirubin: 0.9 mg/dL (ref 0.2–1.2)
Total Protein: 7 g/dL (ref 6.0–8.3)

## 2018-11-25 LAB — LIPID PANEL
Cholesterol: 160 mg/dL (ref 0–200)
HDL: 41.4 mg/dL (ref >39.00)
LDL Cholesterol: 94 mg/dL (ref 0–99)
NonHDL: 118.23
Total CHOL/HDL Ratio: 4
Triglycerides: 122 mg/dL (ref 0.0–149.0)
VLDL: 24.4 mg/dL (ref 0.0–40.0)

## 2018-11-25 LAB — HEMOGLOBIN A1C: Hgb A1c MFr Bld: 5.4 % (ref 4.6–6.5)

## 2018-11-25 LAB — PSA, MEDICARE: PSA: 1.06 ng/ml (ref 0.10–4.00)

## 2018-11-26 DIAGNOSIS — D1801 Hemangioma of skin and subcutaneous tissue: Secondary | ICD-10-CM | POA: Diagnosis not present

## 2018-11-26 DIAGNOSIS — Z85828 Personal history of other malignant neoplasm of skin: Secondary | ICD-10-CM | POA: Diagnosis not present

## 2018-11-26 DIAGNOSIS — L821 Other seborrheic keratosis: Secondary | ICD-10-CM | POA: Diagnosis not present

## 2018-11-26 DIAGNOSIS — L812 Freckles: Secondary | ICD-10-CM | POA: Diagnosis not present

## 2018-11-27 ENCOUNTER — Other Ambulatory Visit: Payer: Self-pay

## 2018-11-27 ENCOUNTER — Ambulatory Visit (INDEPENDENT_AMBULATORY_CARE_PROVIDER_SITE_OTHER): Payer: Medicare Other | Admitting: Family Medicine

## 2018-11-27 ENCOUNTER — Encounter: Payer: Self-pay | Admitting: Family Medicine

## 2018-11-27 VITALS — BP 124/72 | HR 71 | Temp 98.7°F | Ht 68.75 in | Wt 204.5 lb

## 2018-11-27 DIAGNOSIS — Z91199 Patient's noncompliance with other medical treatment and regimen due to unspecified reason: Secondary | ICD-10-CM | POA: Insufficient documentation

## 2018-11-27 DIAGNOSIS — Z9119 Patient's noncompliance with other medical treatment and regimen: Secondary | ICD-10-CM

## 2018-11-27 NOTE — Progress Notes (Signed)
Conversation began with discussion of his wife's situation.  He reported that she had applied for disability and this had been approved.  I stated that it was unfortunate that she needed to apply for disability in the first place but I was hopeful this would provide them with some help.  She had sent in paperwork to be filled out and staff gave the completed form to him to deliver to her.  He said that he would give it to her for [place].  Medicare wellness visit started with routine discussion about medications, allergies, past medical history, etc.  Reviewed questionnaire per routine at [facility] wellness visits.  See scanned forms.  Routine conversation until discussion of vaccination.    Advised him to get flu and pneumonia vaccine.  He declined.  He has previously repeatedly declined routine vaccinations. He acknowledged that we have talked about this before.  The point of preventive care was discussed with patient.  I verbalized that it is counterproductive to come in for a preventive care visit and then decline preventive care.  The volume of his voice increased and he said "Do I need to find another doctor?"  I responded by asking him if he thought I gave him bad advice.  If he thinks I give him bad advice by recommending appropriate vaccination per CDC guidelines, then it is certainly within his right to find another physician.  If he thinks I give him good advice, then he can follow through as advised.  He said, "So you are telling me I need to find another doctor?"  I did not and have not advised him to find a different doctor.  I have never refused him care.  I have given him routine vaccination advice.  He said he would find another doctor and got up to leave.  I did ask him if he had enough of his medications so he would not run out in the interval.  He said that he did and upon review of the EMR that appears to be correct.  I gave him a copy of his labs.  We did not get to discuss those before he cut  the visit short.  We did not address other issues at the visit.  There is the issue of fatty deposits in his liver with the recommendation to get a follow-up MRI.  Staff previously left a detailed message on his voicemail and his wife previously called back acknowledging that issue.  I will defer that and his other medical issues to whichever clinic he chooses for follow-up.  No charge for visit.

## 2018-11-27 NOTE — Assessment & Plan Note (Signed)
See above. He refuses flu and pneumonia vaccine because "there are things about vaccines that you do not know about, that maybe you should learn about."

## 2018-12-22 ENCOUNTER — Telehealth: Payer: Self-pay

## 2018-12-22 NOTE — Telephone Encounter (Signed)
Please advise on transfer request.

## 2018-12-22 NOTE — Telephone Encounter (Signed)
Spoke with patient's wife and explained the situation and Dr. Shelby Mattocks response.  Patients wife was very pleasant and understanding with positive things to say about Dr. Damita Dunnings.    I explained that patient was not formally discharged but left of his on free will.  He is not "black listed" as wife asked, from the practice and can go anywhere within [place]. However, as a whole, [facility] is a very PRO VACCINATION clinic and many providers feel very passionate regarding these preventive health measures as research has proven it saves lives and is best practice for health maintenance and safety.    Wife verbalizes understanding but feels like the patient should be allowed to make decisions for his/her own body based on their beliefs and own understanding.   Wife will continue to research options for her husband but may start looking out of the [place] care spectrum.   I did offer to be sure that patient has enough refills for the next 30 days to allow him time.  Wife understands but doesn't think he needs anything.  In review of medication record, patient has enough medications to last through the end of the year so no refills are needed during transfer period.   Dr. Damita Dunnings is aware.

## 2018-12-22 NOTE — Telephone Encounter (Signed)
Dr. Josefine Class office please advise patient on denial of transfer to Dr. Rogers Blocker. Thank you

## 2018-12-22 NOTE — Telephone Encounter (Signed)
See below.  I don't have any advice for him about the transfer.  He said he would find another doctor and left the office here.  I'll defer to him.

## 2018-12-22 NOTE — Telephone Encounter (Signed)
Not at this time. We would not be a good fit as I too recommend routine vaccinations like his flu and pneumonia shot.  Sorry!  Orma Flaming, MD [place]

## 2018-12-22 NOTE — Telephone Encounter (Signed)
Copied from [place] [id number]. Topic: Appointment Scheduling - Transfer of Care >> Dec 22, 2018 11:28 AM Sheran Luz wrote: Pt is requesting to transfer FROM: Dr. Damita Dunnings ([place])  Pt is requesting to transfer TO: Dr. Rogers Blocker [facility])  Reason for requested transfer: Patient does not feel like Dr. Damita Dunnings is a good fit for him, as he felt pressured into having a flu shot which he declined (Does not show Dr. Damita Dunnings as PCP but patient's wife states they were never informed of being discharged if he was). Patient inquired if he could switch to Dr. Rogers Blocker at [facility] and would also like to know if Dr. Rogers Blocker would "make" him receive vaccine.  Please contact wife, as patients phone is not working today.

## 2018-12-23 NOTE — Telephone Encounter (Signed)
Thanks

## 2018-12-25 DIAGNOSIS — M25511 Pain in right shoulder: Secondary | ICD-10-CM | POA: Diagnosis not present

## 2018-12-25 DIAGNOSIS — I10 Essential (primary) hypertension: Secondary | ICD-10-CM | POA: Diagnosis not present

## 2018-12-25 DIAGNOSIS — K8020 Calculus of gallbladder without cholecystitis without obstruction: Secondary | ICD-10-CM | POA: Diagnosis not present

## 2019-01-20 DIAGNOSIS — M6249 Contracture of muscle, multiple sites: Secondary | ICD-10-CM | POA: Diagnosis not present

## 2019-01-20 DIAGNOSIS — M9903 Segmental and somatic dysfunction of lumbar region: Secondary | ICD-10-CM | POA: Diagnosis not present

## 2019-01-20 DIAGNOSIS — M9901 Segmental and somatic dysfunction of cervical region: Secondary | ICD-10-CM | POA: Diagnosis not present

## 2019-01-20 DIAGNOSIS — M545 Low back pain: Secondary | ICD-10-CM | POA: Diagnosis not present

## 2019-01-27 DIAGNOSIS — M9903 Segmental and somatic dysfunction of lumbar region: Secondary | ICD-10-CM | POA: Diagnosis not present

## 2019-01-27 DIAGNOSIS — M545 Low back pain: Secondary | ICD-10-CM | POA: Diagnosis not present

## 2019-01-27 DIAGNOSIS — M9901 Segmental and somatic dysfunction of cervical region: Secondary | ICD-10-CM | POA: Diagnosis not present

## 2019-01-27 DIAGNOSIS — M6249 Contracture of muscle, multiple sites: Secondary | ICD-10-CM | POA: Diagnosis not present

## 2019-02-25 ENCOUNTER — Other Ambulatory Visit: Payer: Self-pay

## 2019-02-25 ENCOUNTER — Encounter: Payer: Self-pay | Admitting: Gastroenterology

## 2019-02-25 ENCOUNTER — Telehealth: Payer: Self-pay | Admitting: Gastroenterology

## 2019-02-25 ENCOUNTER — Ambulatory Visit (INDEPENDENT_AMBULATORY_CARE_PROVIDER_SITE_OTHER): Payer: Medicare Other | Admitting: Gastroenterology

## 2019-02-25 VITALS — BP 120/72 | HR 64 | Temp 97.7°F | Ht 70.0 in | Wt 211.0 lb

## 2019-02-25 DIAGNOSIS — K8020 Calculus of gallbladder without cholecystitis without obstruction: Secondary | ICD-10-CM

## 2019-02-25 DIAGNOSIS — K760 Fatty (change of) liver, not elsewhere classified: Secondary | ICD-10-CM | POA: Diagnosis not present

## 2019-02-25 DIAGNOSIS — R932 Abnormal findings on diagnostic imaging of liver and biliary tract: Secondary | ICD-10-CM

## 2019-02-25 DIAGNOSIS — K769 Liver disease, unspecified: Secondary | ICD-10-CM

## 2019-02-25 NOTE — Patient Instructions (Signed)
Per your request we did not schedule your MRI today. Please contact our office when you find out the dates available to schedule.   Call our office if you experience serious abdominal pain or go directly to the ER.

## 2019-02-25 NOTE — Telephone Encounter (Signed)
Pt's wife called to schedule MRI.

## 2019-02-25 NOTE — Progress Notes (Signed)
Review of pertinent gastrointestinal problems: 1.  Adenomatous colon polyp.  Colonoscopy October 2016 Dr. Ardis Hughs found a single subcentimeter adenoma.  Previously recommended 5-year interval however newest guidelines show 7-year interval is more appropriate.   HPI: This is a 68 year old man whom I last saw about 4 years ago the time of a colonoscopy.  Chief complaint is abnormal ultrasound  Twice in the past year or so he has had significant abdominal pains and sought medical care.  Both of these times were down in [place].  He underwent testing which at both times include abdominal ultrasound.  See those results summarized above.  Gallstones were noted on both ultrasounds as well as cystic lesions in the liver.  He has not had no other significant abdominal pains other than those 2 times.  He attributes both of those times not to gallstones but to dietary indiscretions the day before.  He has changed his diet over the past year or so and has successfully lost 15 to 20 pounds in the past 6 or 7 months.  He has no significant nausea, vomiting.  No overt GI bleeding.  No troubles with his bowels.  Liver disease does not run in his family.  He is a nondrinker  Old Data Reviewed: Ultrasound report from [facility] October 2019; "reason for exam: Upper quadrant"; findings gallstones, hepatic steatosis, multiple cysts in the liver, the largest 4.8 cm.  Abdominal ultrasound June 2020 also in [place] however report stated no comparisons available so it must of been a different facility.  Indication right upper quadrant pain.  Findings gallstones in the gallbladder, fatty liver, cyst in the liver including a "complex cystic structure" for which he was recommended to have an MRI by the radiologist  Blood work August [id number] complete metabolic profile was normal including normal liver tests     Review of systems: Pertinent positive and negative review of systems were noted in the above HPI  section. All other review negative.   Past Medical History:  Diagnosis Date  . Fatty liver   . Gallstones   . Headache(784.0)    prev history, extensive w/u with no clear diagnosis  . history of CT of pelvis 11/10/2001   normal  . Hyperlipidemia   . Hypertension   . Liver cyst   . Prostatitis 10/2001   Prostate abscess (Dr. Gaynelle Arabian)    Past Surgical History:  Procedure Laterality Date  . ACROMIO-CLAVICULAR JOINT REPAIR    . Carotid Doppler  07/2000   Negative, also MRA negative  . MRI//MRA Brain  2002   Negative  . TEMPORAL ARTERY BIOPSY / LIGATION  2002   Negative  . TONSILLECTOMY  1972  . VASECTOMY      Current Outpatient Medications  Medication Sig Dispense Refill  . amLODipine (NORVASC) 10 MG tablet Take 1 tablet (10 mg total) by mouth daily. 90 tablet 1  . hydrochlorothiazide (HYDRODIURIL) 25 MG tablet Take 1 tablet (25 mg total) by mouth daily. 90 tablet 1   No current facility-administered medications for this visit.     Allergies as of 02/25/2019 - Review Complete 02/25/2019  Allergen Reaction Noted  . Meperidine hcl  07/08/2007    Family History  Problem Relation Age of Onset  . Heart disease Father        open heart surgery//  . Heart attack Father        multiple times  . Hypertension Paternal Grandmother        also hypertension   .  Stroke Paternal Grandfather        also cancer  . Stroke Mother   . Colon cancer Maternal Aunt        dx'd late in life  . Colon cancer Maternal Grandfather   . Prostate cancer Neg Hx     Social History   Socioeconomic History  . Marital status: Married    Spouse name: Not on file  . Number of children: 2  . Years of education: Master's  . Highest education level: Not on file  Occupational History  . Occupation: Training and development officer: self employed     Comment: [employer]  . Financial resource strain: Not on file  . Food insecurity    Worry:  Not on file    Inability: Not on file  . Transportation needs    Medical: Not on file    Non-medical: Not on file  Tobacco Use  . Smoking status: Never Smoker  . Smokeless tobacco: Never Used  Substance and Sexual Activity  . Alcohol use: No    Alcohol/week: 0.0 standard drinks  . Drug use: No  . Sexual activity: Not on file  Lifestyle  . Physical activity    Days per week: Not on file    Minutes per session: Not on file  . Stress: Not on file  Relationships  . Social Herbalist on phone: Not on file    Gets together: Not on file    Attends religious service: Not on file    Active member of club or organization: Not on file    Attends meetings of clubs or organizations: Not on file    Relationship status: Not on file  . Intimate partner violence    Fear of current or ex partner: Not on file    Emotionally abused: Not on file    Physically abused: Not on file    Forced sexual activity: Not on file  Other Topics Concern  . Not on file  Social History Narrative   Married 1987 and lives with wife   1 daughter and 1 son     Physical Exam: BP 120/72   Pulse 64   Temp 97.7 F (36.5 C)   Ht 5\' 10"  (1.778 m)   Wt 211 lb (95.7 kg)   BMI 30.28 kg/m  Constitutional: generally well-appearing Psychiatric: alert and oriented x3 Eyes: extraocular movements intact Mouth: oral pharynx moist, no lesions Neck: supple no lymphadenopathy Cardiovascular: heart regular rate and rhythm Lungs: clear to auscultation bilaterally Abdomen: soft, nontender, nondistended, no obvious ascites, no peritoneal signs, normal bowel sounds Extremities: no lower extremity edema bilaterally Skin: no lesions on visible extremities   Assessment and plan: 69 y.o. male with gallstones, fatty liver, cystic lesions in the liver on ultrasound  First his epigastric abdominal pains may indeed have been gallstone related.  It is difficult to say.  He does not think that the gallstones were the  culprits.  I offered surgical referral for their opinion.  He is not interested at this time however he knows that if he has significant recurrent abdominal pains to call here or to present to the emergency room.  We discussed the areas in his liver.  Radiologist during the 2021 ultrasound suggested a complex cystic lesion in the liver and for that he recommended MRI follow-up.  I agree with that recommendation.  Patient was not so sure he wanted to have the testing  done because "even if I have cancer I would not do anything about it".  Explained to him that there are multiple different kinds of tumors of the liver many of which are of no clinical consequence, many of which are vastly curable and indeed many of which are not.  Getting more data I think is very helpful in deciding what to do about these areas in his liver.  He agreed as long as it was not too costly and so we are going to order MRI of the liver.    Please see the "Patient Instructions" section for addition details about the plan.   Owens Loffler, MD [facility] 02/25/2019, 8:38 AM

## 2019-02-25 NOTE — Telephone Encounter (Signed)
The patient has been notified of this information and all questions answered.    You have been scheduled for an MRI at [facility] on 03/04/19. Your appointment time is 1 pm. Please arrive 15 minutes prior to your appointment time for registration purposes. Please make certain not to have anything to eat or drink 6 hours prior to your test. In addition, if you have any metal in your body, have a pacemaker or defibrillator, please be sure to let your ordering physician know. This test typically takes 45 minutes to 1 hour to complete. Should you need to reschedule, please call [phone] to do so.

## 2019-03-02 DIAGNOSIS — M25511 Pain in right shoulder: Secondary | ICD-10-CM | POA: Diagnosis not present

## 2019-03-02 DIAGNOSIS — Z6829 Body mass index (BMI) 29.0-29.9, adult: Secondary | ICD-10-CM | POA: Diagnosis not present

## 2019-03-04 ENCOUNTER — Ambulatory Visit (HOSPITAL_COMMUNITY): Payer: Medicare Other

## 2019-03-10 ENCOUNTER — Telehealth: Payer: Self-pay | Admitting: Gastroenterology

## 2019-03-16 NOTE — Telephone Encounter (Signed)
Lab order has been faxed as requested

## 2019-03-18 DIAGNOSIS — R932 Abnormal findings on diagnostic imaging of liver and biliary tract: Secondary | ICD-10-CM | POA: Diagnosis not present

## 2019-03-24 DIAGNOSIS — I10 Essential (primary) hypertension: Secondary | ICD-10-CM | POA: Diagnosis not present

## 2019-03-24 DIAGNOSIS — M25511 Pain in right shoulder: Secondary | ICD-10-CM | POA: Diagnosis not present

## 2019-03-24 DIAGNOSIS — Z6829 Body mass index (BMI) 29.0-29.9, adult: Secondary | ICD-10-CM | POA: Diagnosis not present

## 2019-03-24 DIAGNOSIS — K8020 Calculus of gallbladder without cholecystitis without obstruction: Secondary | ICD-10-CM | POA: Diagnosis not present

## 2019-03-30 ENCOUNTER — Other Ambulatory Visit: Payer: Self-pay

## 2019-03-30 ENCOUNTER — Ambulatory Visit (HOSPITAL_COMMUNITY)
Admission: RE | Admit: 2019-03-30 | Discharge: 2019-03-30 | Disposition: A | Discharge status: Home / Self Care | Payer: Medicare Other | Source: Ambulatory Visit | Attending: Gastroenterology | Admitting: Gastroenterology

## 2019-03-30 DIAGNOSIS — K769 Liver disease, unspecified: Secondary | ICD-10-CM | POA: Insufficient documentation

## 2019-03-30 DIAGNOSIS — R932 Abnormal findings on diagnostic imaging of liver and biliary tract: Secondary | ICD-10-CM | POA: Diagnosis not present

## 2019-03-30 DIAGNOSIS — K7689 Other specified diseases of liver: Secondary | ICD-10-CM | POA: Diagnosis not present

## 2019-03-30 MED ORDER — GADOBUTROL 1 MMOL/ML IV SOLN
10.0000 mL | Freq: Once | INTRAVENOUS | Status: AC | PRN
  Administered 2019-03-30: 10 mL via INTRAVENOUS

## 2019-04-16 DIAGNOSIS — M25511 Pain in right shoulder: Secondary | ICD-10-CM | POA: Diagnosis not present

## 2019-04-16 DIAGNOSIS — L729 Follicular cyst of the skin and subcutaneous tissue, unspecified: Secondary | ICD-10-CM | POA: Diagnosis not present

## 2019-06-29 DIAGNOSIS — L729 Follicular cyst of the skin and subcutaneous tissue, unspecified: Secondary | ICD-10-CM | POA: Diagnosis not present

## 2019-06-29 DIAGNOSIS — M25511 Pain in right shoulder: Secondary | ICD-10-CM | POA: Diagnosis not present

## 2019-12-07 ENCOUNTER — Other Ambulatory Visit: Payer: Self-pay | Admitting: Sports Medicine

## 2019-12-07 ENCOUNTER — Other Ambulatory Visit: Payer: Self-pay

## 2019-12-07 ENCOUNTER — Ambulatory Visit
Admission: RE | Admit: 2019-12-07 | Discharge: 2019-12-07 | Disposition: A | Discharge status: Home / Self Care | Payer: Medicare Other | Source: Ambulatory Visit | Attending: Sports Medicine | Admitting: Sports Medicine

## 2019-12-07 DIAGNOSIS — L729 Follicular cyst of the skin and subcutaneous tissue, unspecified: Secondary | ICD-10-CM | POA: Diagnosis not present

## 2019-12-07 DIAGNOSIS — M25511 Pain in right shoulder: Secondary | ICD-10-CM

## 2019-12-07 DIAGNOSIS — M19011 Primary osteoarthritis, right shoulder: Secondary | ICD-10-CM | POA: Diagnosis not present

## 2019-12-29 ENCOUNTER — Other Ambulatory Visit: Payer: Self-pay | Admitting: Sports Medicine

## 2019-12-29 DIAGNOSIS — M25511 Pain in right shoulder: Secondary | ICD-10-CM

## 2020-01-06 DIAGNOSIS — R6889 Other general symptoms and signs: Secondary | ICD-10-CM | POA: Diagnosis not present

## 2020-01-06 DIAGNOSIS — H905 Unspecified sensorineural hearing loss: Secondary | ICD-10-CM | POA: Diagnosis not present

## 2020-01-18 ENCOUNTER — Other Ambulatory Visit: Payer: Medicare Other

## 2020-01-20 DIAGNOSIS — K429 Umbilical hernia without obstruction or gangrene: Secondary | ICD-10-CM | POA: Diagnosis not present

## 2020-01-20 DIAGNOSIS — I10 Essential (primary) hypertension: Secondary | ICD-10-CM | POA: Diagnosis not present

## 2020-01-29 ENCOUNTER — Other Ambulatory Visit: Payer: Self-pay

## 2020-01-29 ENCOUNTER — Ambulatory Visit
Admission: RE | Admit: 2020-01-29 | Discharge: 2020-01-29 | Disposition: A | Discharge status: Home / Self Care | Payer: Medicare Other | Source: Ambulatory Visit | Attending: Sports Medicine | Admitting: Sports Medicine

## 2020-01-29 DIAGNOSIS — M25511 Pain in right shoulder: Secondary | ICD-10-CM | POA: Diagnosis not present

## 2020-01-29 DIAGNOSIS — M19011 Primary osteoarthritis, right shoulder: Secondary | ICD-10-CM | POA: Diagnosis not present

## 2020-01-29 MED ORDER — IOPAMIDOL (ISOVUE-M 200) INJECTION 41%
15.0000 mL | Freq: Once | INTRAMUSCULAR | Status: AC
  Administered 2020-01-29: 15 mL via INTRA_ARTICULAR

## 2020-02-08 DIAGNOSIS — D1801 Hemangioma of skin and subcutaneous tissue: Secondary | ICD-10-CM | POA: Diagnosis not present

## 2020-02-08 DIAGNOSIS — D0471 Carcinoma in situ of skin of right lower limb, including hip: Secondary | ICD-10-CM | POA: Diagnosis not present

## 2020-02-08 DIAGNOSIS — Z85828 Personal history of other malignant neoplasm of skin: Secondary | ICD-10-CM | POA: Diagnosis not present

## 2020-02-08 DIAGNOSIS — D485 Neoplasm of uncertain behavior of skin: Secondary | ICD-10-CM | POA: Diagnosis not present

## 2020-02-08 DIAGNOSIS — D225 Melanocytic nevi of trunk: Secondary | ICD-10-CM | POA: Diagnosis not present

## 2020-02-08 DIAGNOSIS — L821 Other seborrheic keratosis: Secondary | ICD-10-CM | POA: Diagnosis not present

## 2020-12-20 IMAGING — MR MR ABDOMEN WO/W CM
9 of 18 series · 21 of 48 positions shown · IV contrast (gadavist)
Comparison: None.

CLINICAL DATA: Liver cysts on outside ultrasound

EXAM:
MRI ABDOMEN WITHOUT AND WITH CONTRAST
TECHNIQUE: Multiplanar multisequence MR imaging of the abdomen was performed
both before and after the administration of intravenous contrast.
CONTRAST:  10mL GADAVIST GADOBUTROL 1 MMOL/ML IV SOLN

[Series 3: T2 fat-sat · axial · 5.0mm · 0.82mm/px · z∈[-95,+165]mm · 2 of 53 slices shown]
[im 1/53]
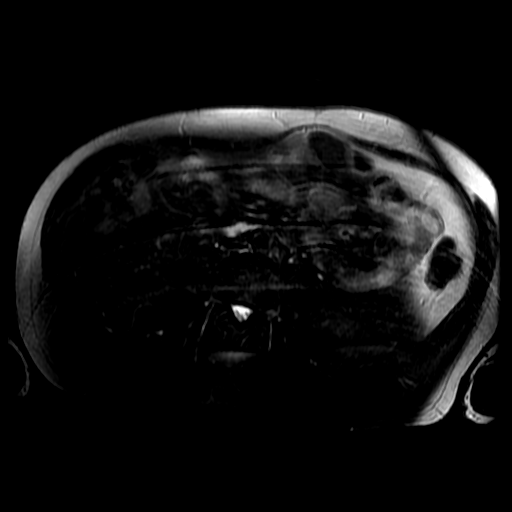
[im 53/53]
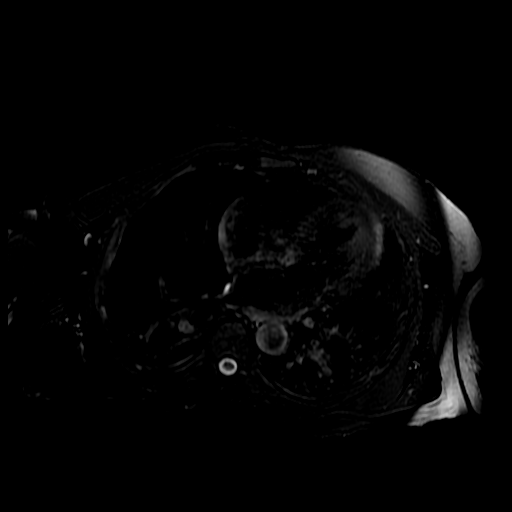

[Series 4: DWI b500 · axial · 6.0mm · 1.56mm/px · z∈[-115,+174]mm · 2 of 76 slices shown]
[im 1/76]
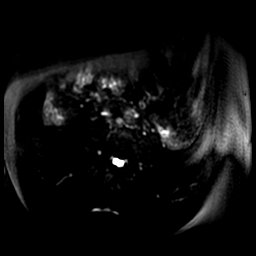
[im 76/76]
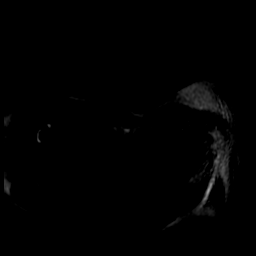

[Series 5: T2 · axial · 5.0mm · 0.78mm/px · z∈[-116,+174]mm · 2 of 59 slices shown (1 of 2)]
[im 1/59]
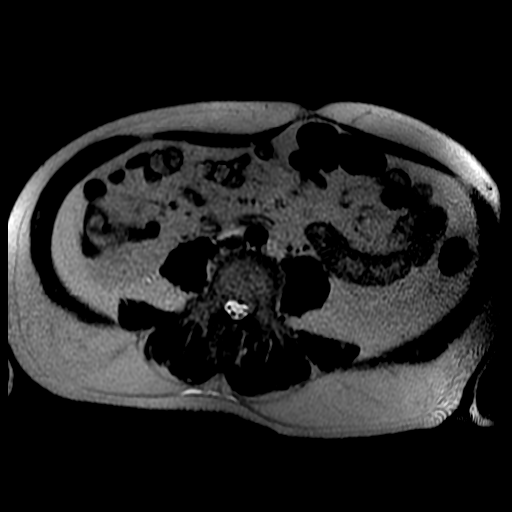
[im 59/59]
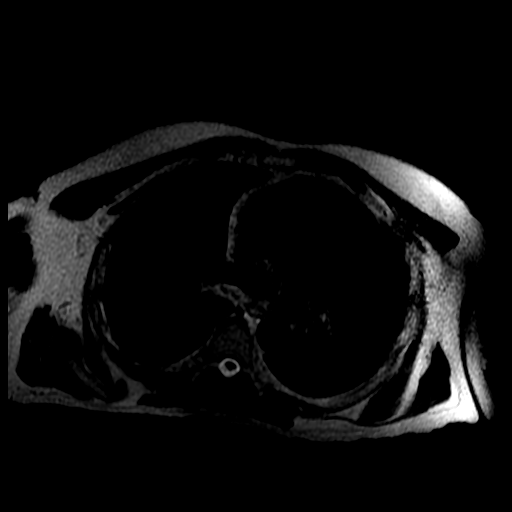

[Series 6: T2 · coronal · 5.0mm · 0.82mm/px · 2 of 48 slices shown (2 of 2)]
[im 1/48]
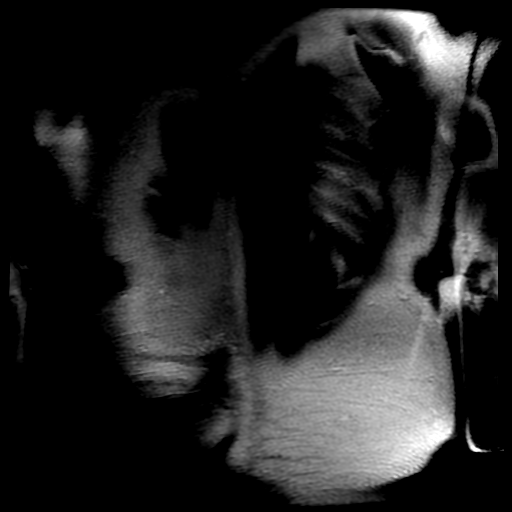
[im 48/48]
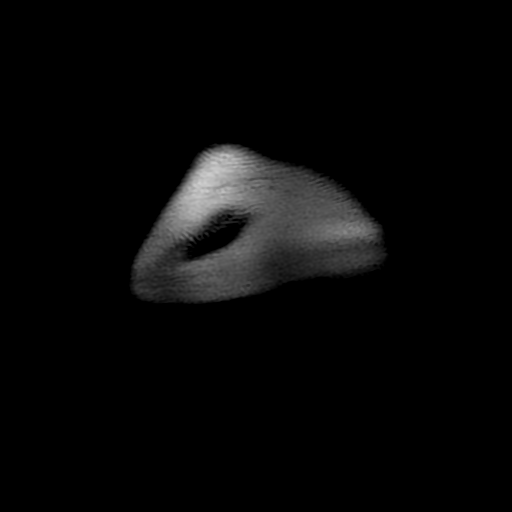

[Series 7: bSSFP · axial · 5.0mm · 0.78mm/px · z∈[-116,+174]mm · 2 of 59 slices shown]
[im 1/59]
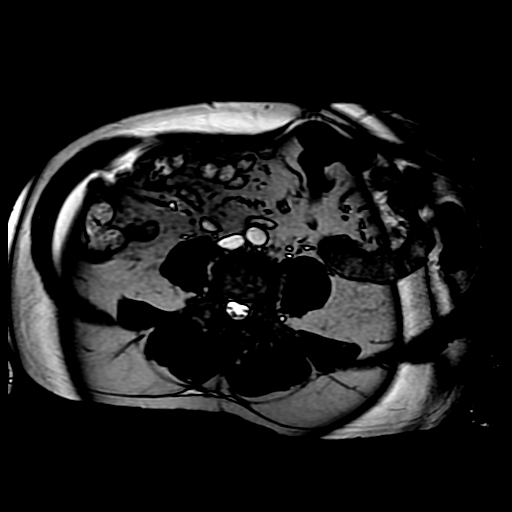
[im 59/59]
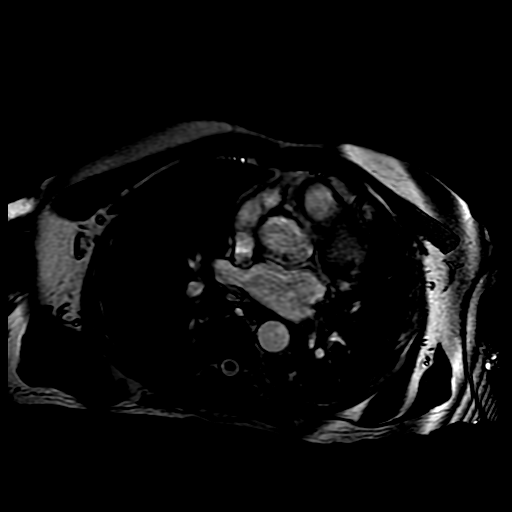

[Series 8: ax dualecho bh · axial · 5.0mm · 0.78mm/px · z∈[-116,+174]mm · 4 of 118 slices shown]
[im 1/118]
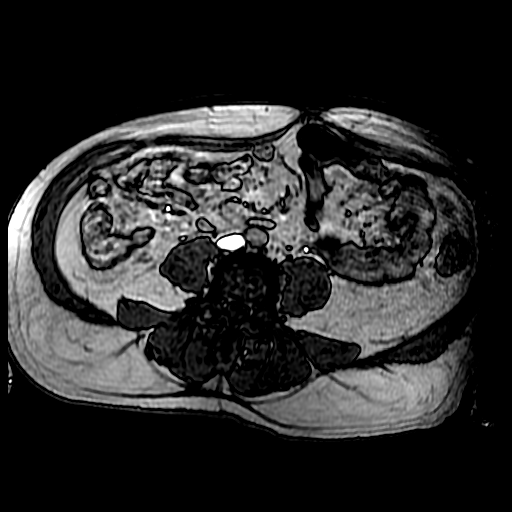
[im 40/118]
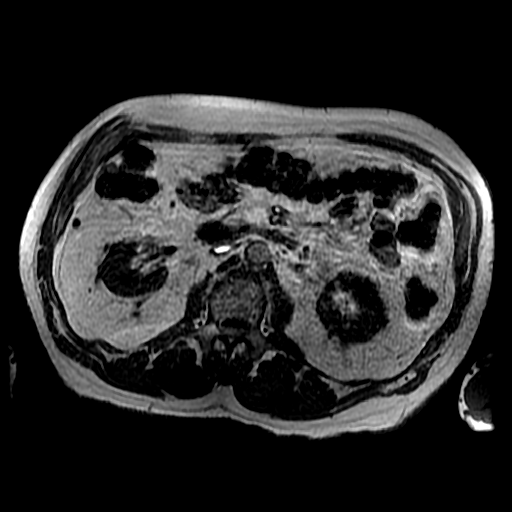
[im 79/118]
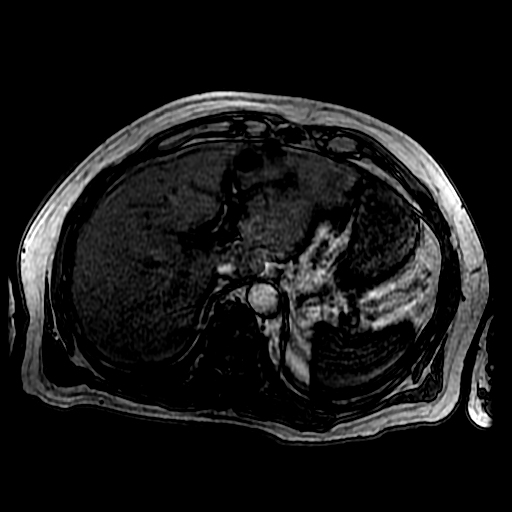
[im 118/118]
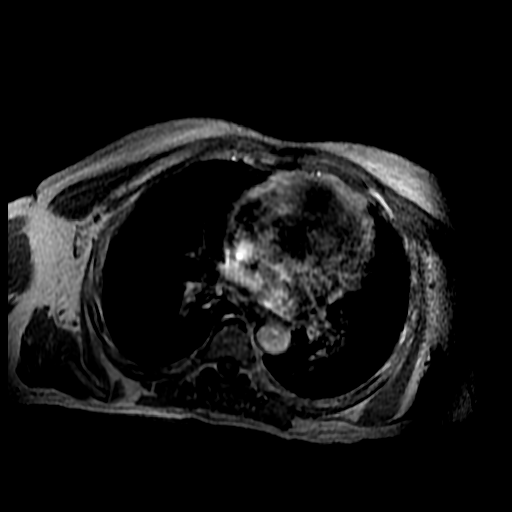

[Series 400: DWI · axial · 6.0mm · 1.56mm/px · 1 of 38 slices shown]
[im 1/38]
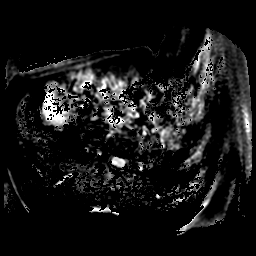

[Series 900: T1 dynamic · axial · 6.0mm · 0.86mm/px · z∈[-114,+171]mm · 3 of 96 slices shown (1 of 2)]
[im 1/96]
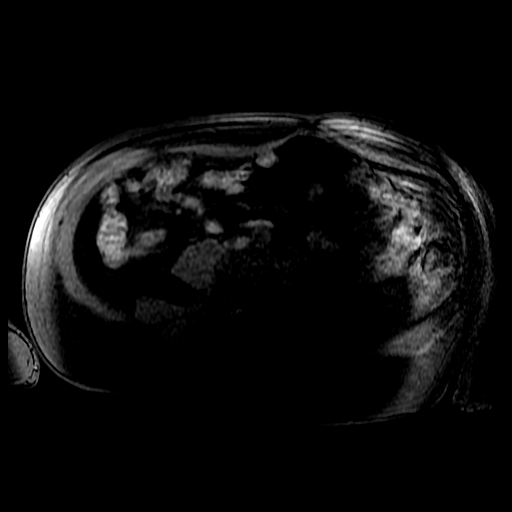
[im 48/96]
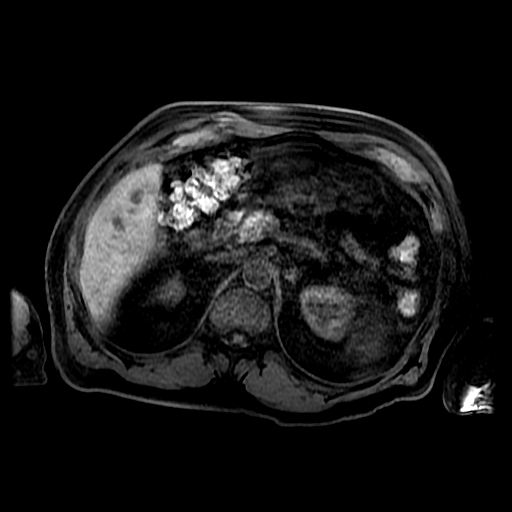
[im 96/96]
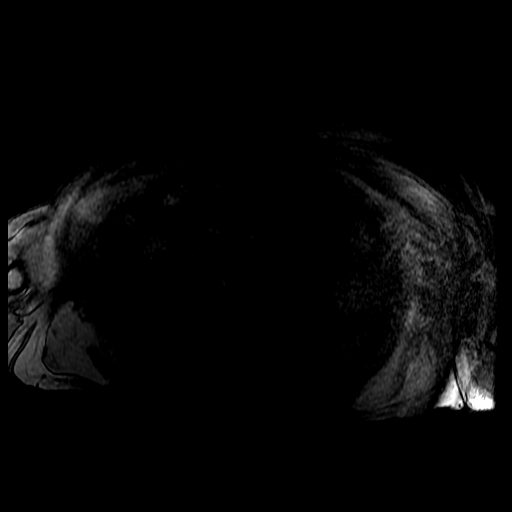

[Series 901: T1 dynamic · axial · 6.0mm · 0.86mm/px · z∈[-114,+171]mm · 3 of 96 slices shown (2 of 2)]
[im 1/96]
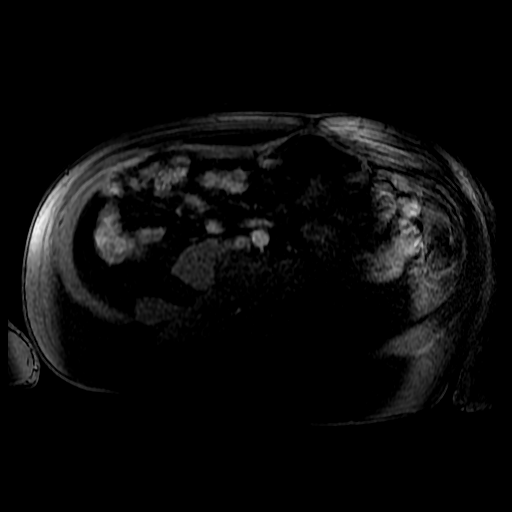
[im 48/96]
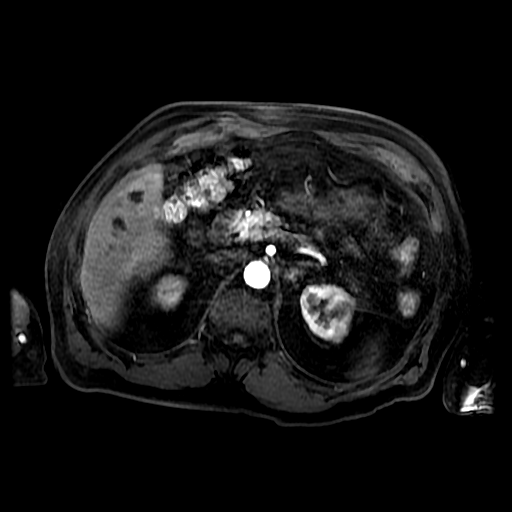
[im 96/96]
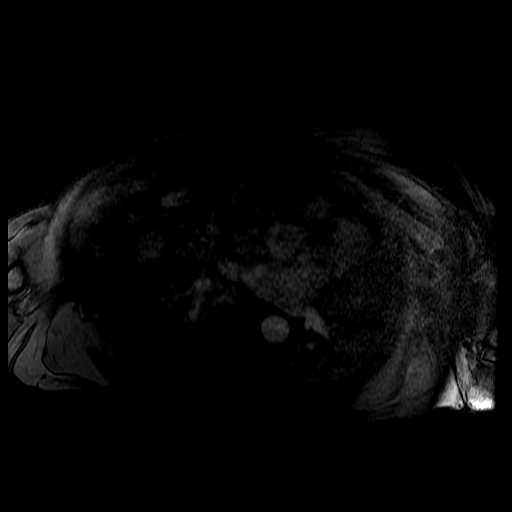

[21 of 48 positions shown; findings below may reference images not displayed]

FINDINGS: Lower chest: Lung bases are clear.

Hepatobiliary: Numerous scattered hepatic cysts, including two
adjacent lobulated cysts measuring up to 2.5 cm along the medial
aspect of segment 3 (series 5/image 23), benign. No
suspicious/enhancing hepatic lesions. Mild hepatic steatosis.

Numerous gallstones (series 5/image 26), without associated
inflammatory changes. No intrahepatic or extrahepatic ductal
dilatation. Common duct measures 4 mm. No choledocholithiasis is
seen.

Pancreas:  Within normal limits.

Spleen:  Within normal limits.

Adrenals/Urinary Tract:  Adrenal glands are within normal limits.

Kidneys are within normal limits. No hydronephrosis.

Stomach/Bowel: Stomach is within normal limits.

Visualized bowel is unremarkable, noting a normal appendix (series
5/image 40).

Vascular/Lymphatic:  No evidence of abdominal aortic aneurysm.

No suspicious abdominal lymphadenopathy

Other:  No abdominal ascites.

Musculoskeletal: Mild degenerative changes of the lumbar spine.
IMPRESSION: Scattered hepatic cysts, measuring up to 2.5 cm, benign. No
suspicious/enhancing hepatic lesions. Mild hepatic steatosis.

Cholelithiasis, without associated inflammatory changes. No
intrahepatic or extrahepatic ductal dilatation. Common duct measures
4 mm.

## 2021-05-17 ENCOUNTER — Other Ambulatory Visit: Payer: Self-pay

## 2021-05-17 ENCOUNTER — Ambulatory Visit (INDEPENDENT_AMBULATORY_CARE_PROVIDER_SITE_OTHER): Payer: Medicare Other | Admitting: Cardiovascular Disease

## 2021-05-17 ENCOUNTER — Encounter: Payer: Self-pay | Admitting: Cardiovascular Disease

## 2021-05-17 VITALS — BP 148/80 | HR 59 | Ht 70.5 in | Wt 217.8 lb

## 2021-05-17 DIAGNOSIS — I10 Essential (primary) hypertension: Secondary | ICD-10-CM

## 2021-05-17 DIAGNOSIS — E7800 Pure hypercholesterolemia, unspecified: Secondary | ICD-10-CM

## 2021-05-17 NOTE — Assessment & Plan Note (Signed)
History of essential hypertension on amlodipine and hydrochlorothiazide with blood pressure measured today 148/80.  He checks his blood pressure daily and usually it is much lower than this at home.

## 2021-05-17 NOTE — Assessment & Plan Note (Signed)
History of hyperlipidemia not on statin therapy.  We will recheck a lipid liver profile.

## 2021-05-17 NOTE — Patient Instructions (Signed)
Medication Instructions:  Your physician recommends that you continue on your current medications as directed. Please refer to the Current Medication list given to you today.  *If you need a refill on your cardiac medications before your next appointment, please call your pharmacy*   Testing/Procedures: Dr. Gwenlyn Found has ordered a CT coronary calcium score.   Test locations:  [facility] ([address] [place], [place] [address]) [facility] ([address] [place], [place] [address])   This is $99 out of pocket.   Coronary CalciumScan A coronary calcium scan is an imaging test used to look for deposits of calcium and other fatty materials (plaques) in the inner lining of the blood vessels of the heart (coronary arteries). These deposits of calcium and plaques can partly clog and narrow the coronary arteries without producing any symptoms or warning signs. This puts a person at risk for a heart attack. This test can detect these deposits before symptoms develop. Tell a health care provider about: Any allergies you have. All medicines you are taking, including vitamins, herbs, eye drops, creams, and over-the-counter medicines. Any problems you or family members have had with anesthetic medicines. Any blood disorders you have. Any surgeries you have had. Any medical conditions you have. Whether you are pregnant or may be pregnant. What are the risks? Generally, this is a safe procedure. However, problems may occur, including: Harm to a pregnant woman and her unborn baby. This test involves the use of radiation. Radiation exposure can be dangerous to a pregnant woman and her unborn baby. If you are pregnant, you generally should not have this procedure done. Slight increase in the risk of cancer. This is because of the radiation involved in the test. What happens before the procedure? No preparation is needed for this procedure. What happens during the procedure? You will  undress and remove any jewelry around your neck or chest. You will put on a hospital gown. Sticky electrodes will be placed on your chest. The electrodes will be connected to an electrocardiogram (ECG) machine to record a tracing of the electrical activity of your heart. A CT scanner will take pictures of your heart. During this time, you will be asked to lie still and hold your breath for 2-3 seconds while a picture of your heart is being taken. The procedure may vary among health care providers and hospitals. What happens after the procedure? You can get dressed. You can return to your normal activities. It is up to you to get the results of your test. Ask your health care provider, or the department that is doing the test, when your results will be ready. Summary A coronary calcium scan is an imaging test used to look for deposits of calcium and other fatty materials (plaques) in the inner lining of the blood vessels of the heart (coronary arteries). Generally, this is a safe procedure. Tell your health care provider if you are pregnant or may be pregnant. No preparation is needed for this procedure. A CT scanner will take pictures of your heart. You can return to your normal activities after the scan is done. This information is not intended to replace advice given to you by your health care provider. Make sure you discuss any questions you have with your health care provider. Document Released: 10/06/2007 Document Revised: 02/27/2016 Document Reviewed: 02/27/2016 Elsevier Interactive Patient Education  2017 [place]: At [facility], you and your health needs are our priority.  As part of our  continuing mission to provide you with exceptional heart care, we have created designated Provider Care Teams.  These Care Teams include your primary Cardiologist (physician) and Advanced Practice Providers (APPs -  Physician Assistants and Nurse Practitioners) who all work  together to provide you with the care you need, when you need it.  We recommend signing up for the patient portal called "MyChart".  Sign up information is provided on this After Visit Summary.  MyChart is used to connect with patients for Virtual Visits (Telemedicine).  Patients are able to view lab/test results, encounter notes, upcoming appointments, etc.  Non-urgent messages can be sent to your provider as well.   To learn more about what you can do with MyChart, go to [email or web address].    Your next appointment:   6 month(s)  The format for your next appointment:   In Person  Provider:   Quay Burow, MD

## 2021-05-17 NOTE — Progress Notes (Signed)
05/17/2021 David Villanueva   May 26, 1950  [id number]  Primary Physician Patient, No Pcp Per (Inactive) Primary Cardiologist: Lorretta Harp MD [name], [place]  HPI:  David Villanueva is a 71 y.o. mildly overweight married Caucasian male father of 2 children whose wife David Villanueva is also a patient of mine and who accompanies him today.  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  He spends most of his time in [place].  His cardiac risk factors are only notable for hypertension and hyperlipidemia not on statin therapy.  There is no family history of heart disease.  Is never had a heart attack or stroke.  He denies chest pain or shortness of breath.   Current Meds  Medication Sig   amLODipine (NORVASC) 5 MG tablet Take 5 mg by mouth every morning.   hydrochlorothiazide (HYDRODIURIL) 25 MG tablet Take 1 tablet (25 mg total) by mouth daily.     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    Employer: self employed     Comment: [employer]   Tobacco Use   Smoking status: Never   Smokeless tobacco: Never  Substance and Sexual Activity   Alcohol use: No    Alcohol/week: 0.0 standard drinks   Drug use: No   Sexual activity: Not on file  Other Topics Concern   Not on file  Social History Narrative   Married 1987 and lives with wife   1 daughter and 1 son   Social Determinants of Health   Financial Resource Strain: Not on file  Food Insecurity: Not on file  Transportation Needs: Not on file  Physical Activity: Not on file  Stress: Not on file  Social Connections: Not on file  Intimate Partner Violence: Not on file     Review of  Systems: General: negative for chills, fever, night sweats or weight changes.  Cardiovascular: negative for chest pain, dyspnea on exertion, edema, orthopnea, palpitations, paroxysmal nocturnal dyspnea or shortness of breath Dermatological: negative for rash Respiratory: negative for cough or wheezing Urologic: negative for hematuria Abdominal: negative for nausea, vomiting, diarrhea, bright red blood per rectum, melena, or hematemesis Neurologic: negative for visual changes, syncope, or dizziness All other systems reviewed and are otherwise negative except as noted above.    Blood pressure (!) 148/80, pulse (!) 59, height 5' 10.5" (1.791 m), weight 217 lb 12.8 oz (98.8 kg), SpO2 99 %.  General appearance: alert and no distress Neck: no adenopathy, no carotid bruit, no JVD, supple, symmetrical, trachea midline, and thyroid not enlarged, symmetric, no tenderness/mass/nodules Lungs: clear to auscultation bilaterally Heart: regular rate and rhythm, S1, S2 normal, no murmur, click, rub or gallop Extremities: extremities normal, atraumatic, no cyanosis or edema Pulses: 2+ and symmetric Skin: Skin color, texture, turgor normal. No rashes or lesions Neurologic: Grossly normal  EKG sinus bradycardia at 59 without ST or T wave changes.  I personally reviewed this EKG.  ASSESSMENT AND PLAN:   HYPERCHOLESTEROLEMIA History of hyperlipidemia not on statin therapy.  We will recheck a lipid liver profile.  Essential hypertension History of essential hypertension on amlodipine and hydrochlorothiazide with blood  pressure measured today 148/80.  He checks his blood pressure daily and usually it is much lower than this at home.     Lorretta Harp MD FACP,FACC,FAHA, [facility] 05/17/2021 2:59 PM

## 2021-05-18 ENCOUNTER — Telehealth: Payer: Self-pay | Admitting: Cardiovascular Disease

## 2021-05-18 DIAGNOSIS — I10 Essential (primary) hypertension: Secondary | ICD-10-CM

## 2021-05-18 DIAGNOSIS — E7800 Pure hypercholesterolemia, unspecified: Secondary | ICD-10-CM

## 2021-05-18 NOTE — Telephone Encounter (Signed)
Advised patient and wife would fax order as requested  They were both under the impression after seeing Dr Gwenlyn Found yesterday that patient was to have Calcium Score and follow up to go over in March when he was back in town.  Advised patient based on notes looks as though follow up in 6 months Offered to have Calcium score done when they return in March and could call with the results They both insisted Dr Gwenlyn Found wanted to see patient in person to go over results  They would like for it to be confirmed with Dr Gwenlyn Found that follow up is not for 6 months  Will forward to Dr Gwenlyn Found for review

## 2021-05-18 NOTE — Telephone Encounter (Signed)
°  Per MyChart scheduling message:  Dr Gwenlyn Found I am currently scheduled in July for a CT coronary calcium score in [place]. I would like to have this test done in [place] at [place]. This will be more convenient for [place]. Their fax is [phone]. Of you could include as well the demographic sheet with it. The. I would like to schedule a visit if possible March 20 or afternoon of March 21. Or anytime March 17 to discuss lab and ct results. Thanks so much. This will save [place] a lot of travel

## 2021-05-22 ENCOUNTER — Telehealth: Payer: Self-pay | Admitting: Cardiovascular Disease

## 2021-05-22 NOTE — Telephone Encounter (Signed)
Spoke with pt regarding calcium score date and time. Pt is coming from out of town and plans to be in [place] in March. Changed pt's calcium score to March 20th and made pt an appointment to see Dr. Gwenlyn Found on the 21st. Pt will also have lipid panel done prior to visit with Dr. Gwenlyn Found. Pt verbalizes understanding.

## 2021-05-22 NOTE — Telephone Encounter (Signed)
°  Per MyChart scheduling message:  Patient would like a call back regarding missed call from phone note on 05/18/21.

## 2021-05-22 NOTE — Telephone Encounter (Signed)
Left message for pt to call back  °

## 2021-06-01 NOTE — Telephone Encounter (Signed)
See 1/30 phone note. °

## 2021-07-05 LAB — HEPATIC FUNCTION PANEL
ALT: 27 IU/L (ref 0–44)
AST: 21 IU/L (ref 0–40)
Albumin: 4.8 g/dL (ref 3.8–4.8)
Alkaline Phosphatase: 72 IU/L (ref 44–121)
Bilirubin Total: 0.7 mg/dL (ref 0.0–1.2)
Bilirubin, Direct: 0.15 mg/dL (ref 0.00–0.40)
Total Protein: 7.4 g/dL (ref 6.0–8.5)

## 2021-07-05 LAB — LIPID PANEL
Chol/HDL Ratio: 5.1 ratio — ABNORMAL HIGH (ref 0.0–5.0)
Cholesterol, Total: 226 mg/dL — ABNORMAL HIGH (ref 100–199)
HDL: 44 mg/dL (ref >39)
LDL Chol Calc (NIH): 146 mg/dL — ABNORMAL HIGH (ref 0–99)
Triglycerides: 200 mg/dL — ABNORMAL HIGH (ref 0–149)
VLDL Cholesterol Cal: 36 mg/dL (ref 5–40)

## 2021-07-07 ENCOUNTER — Other Ambulatory Visit: Payer: BLUE CROSS/BLUE SHIELD

## 2021-07-10 ENCOUNTER — Other Ambulatory Visit: Payer: Self-pay

## 2021-07-10 ENCOUNTER — Ambulatory Visit (INDEPENDENT_AMBULATORY_CARE_PROVIDER_SITE_OTHER)
Admission: RE | Admit: 2021-07-10 | Discharge: 2021-07-10 | Disposition: A | Discharge status: Home / Self Care | Payer: Self-pay | Source: Ambulatory Visit | Attending: Cardiovascular Disease | Admitting: Cardiovascular Disease

## 2021-07-10 DIAGNOSIS — E7800 Pure hypercholesterolemia, unspecified: Secondary | ICD-10-CM

## 2021-07-11 ENCOUNTER — Ambulatory Visit: Payer: BLUE CROSS/BLUE SHIELD | Admitting: Cardiovascular Disease

## 2021-07-18 ENCOUNTER — Telehealth: Payer: Self-pay

## 2021-07-18 DIAGNOSIS — E7800 Pure hypercholesterolemia, unspecified: Secondary | ICD-10-CM

## 2021-07-18 NOTE — Telephone Encounter (Signed)
Spoke with pt regarding lab work and coronary calcium score. Per Dr. Gwenlyn Found, would like to start pt on atorvastatin '40mg'$  once daily. Pt does not want to start a statin medication at this time. Pt would like to try diet modification for 3 months and re-check labs to see if he can achieve a goal of LDL less than 70. Labs orders placed and mailed to pt. Pt verbalizes understanding.  ?

## 2021-07-18 NOTE — Telephone Encounter (Signed)
-----   Message from Lorretta Harp, MD sent at 07/10/2021  7:21 PM EDT ----- ?LLD 146 with CCS 259. Start Atorva 40 mg. Re check FLP 3 months. LDL target <70 ?

## 2021-09-09 LAB — LIPID PANEL
Chol/HDL Ratio: 5.3 ratio — ABNORMAL HIGH (ref 0.0–5.0)
Cholesterol, Total: 212 mg/dL — ABNORMAL HIGH (ref 100–199)
HDL: 40 mg/dL (ref >39)
LDL Chol Calc (NIH): 145 mg/dL — ABNORMAL HIGH (ref 0–99)
Triglycerides: 148 mg/dL (ref 0–149)
VLDL Cholesterol Cal: 27 mg/dL (ref 5–40)

## 2021-09-12 ENCOUNTER — Ambulatory Visit (INDEPENDENT_AMBULATORY_CARE_PROVIDER_SITE_OTHER): Payer: Medicare Other | Admitting: Cardiovascular Disease

## 2021-09-12 ENCOUNTER — Encounter: Payer: Self-pay | Admitting: Family Medicine

## 2021-09-12 ENCOUNTER — Encounter: Payer: Self-pay | Admitting: Cardiovascular Disease

## 2021-09-12 DIAGNOSIS — I10 Essential (primary) hypertension: Secondary | ICD-10-CM | POA: Diagnosis not present

## 2021-09-12 DIAGNOSIS — R931 Abnormal findings on diagnostic imaging of heart and coronary circulation: Secondary | ICD-10-CM | POA: Diagnosis not present

## 2021-09-12 DIAGNOSIS — E7800 Pure hypercholesterolemia, unspecified: Secondary | ICD-10-CM | POA: Diagnosis not present

## 2021-09-12 MED ORDER — HYDROCHLOROTHIAZIDE 25 MG PO TABS: 25.0000 mg | ORAL_TABLET | Freq: Every day | ORAL | 3 refills | Status: DC

## 2021-09-12 MED ORDER — AMLODIPINE BESYLATE 5 MG PO TABS: 5.0000 mg | ORAL_TABLET | Freq: Every morning | ORAL | 3 refills | Status: DC

## 2021-09-12 MED ORDER — ATORVASTATIN CALCIUM 40 MG PO TABS: 40.0000 mg | ORAL_TABLET | Freq: Every day | ORAL | 3 refills | Status: DC

## 2021-09-12 NOTE — Assessment & Plan Note (Signed)
Coronary calcium score of 259 measured 07/10/2021 with calcium in the left main, LAD and right coronary artery.  He is very active and completely asymptomatic.  His most recent LDL was 145 not on statin therapy.  I am going to begin him on atorvastatin 40 mg a day with LDL goal of less than 70 for secondary prevention.

## 2021-09-12 NOTE — Progress Notes (Signed)
09/12/2021 David Villanueva   1950-08-17  [id number]  Primary Physician Patient, No Pcp Per (Inactive) Primary Cardiologist: Lorretta Harp MD [name], [place]  HPI:  David Villanueva is a 71 y.o.  mildly overweight married Caucasian male father of 2 children whose wife David Villanueva is also a patient of mine and who accompanies him today.  I last saw him in the office 05/17/2021.  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  He spends most of his time in [place].  His cardiac risk factors are only notable for hypertension and hyperlipidemia not on statin therapy.  There is no family history of heart disease.  Is never had a heart attack or stroke.  He denies chest pain or shortness of breath.  I did a coronary calcium score on him 07/10/2021 which was 259 distributed in the LAD and RCA.  He is completely asymptomatic.  His most recent lipid profile performed 09/08/2021 revealed an LDL of 145 not on statin therapy.   Current Meds  Medication Sig   amLODipine (NORVASC) 5 MG tablet Take 5 mg by mouth every morning.   hydrochlorothiazide (HYDRODIURIL) 25 MG tablet Take 1 tablet (25 mg total) by mouth daily.     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    Employer: self employed     Comment: [employer]   Tobacco Use   Smoking status: Never   Smokeless tobacco: Never  Substance and Sexual Activity   Alcohol use: No    Alcohol/week: 0.0 standard drinks   Drug use: No   Sexual activity: Not on file  Other Topics Concern   Not on file  Social History Narrative   Married 1987 and lives with wife   1 daughter and 1 son   Social Determinants of Health    Financial Resource Strain: Not on file  Food Insecurity: Not on file  Transportation Needs: Not on file  Physical Activity: Not on file  Stress: Not on file  Social Connections: Not on file  Intimate Partner Violence: Not on file     Review of Systems: General: negative for chills, fever, night sweats or weight changes.  Cardiovascular: negative for chest pain, dyspnea on exertion, edema, orthopnea, palpitations, paroxysmal nocturnal dyspnea or shortness of breath Dermatological: negative for rash Respiratory: negative for cough or wheezing Urologic: negative for hematuria Abdominal: negative for nausea, vomiting, diarrhea, bright red blood per rectum, melena, or hematemesis Neurologic: negative for visual changes, syncope, or dizziness All other systems reviewed and are otherwise negative except as noted above.    Blood pressure (!) 142/88, pulse (!) 56, height 5' 10.5" (1.791 m), weight 222 lb 9.6 oz (101 kg), SpO2 98 %.  General appearance: alert and no distress Neck: no adenopathy, no carotid bruit, no JVD, supple, symmetrical, trachea midline, and thyroid not enlarged, symmetric, no tenderness/mass/nodules Lungs: clear to auscultation bilaterally Heart: regular rate and rhythm, S1, S2 normal, no murmur, click, rub or gallop Extremities: extremities normal, atraumatic, no cyanosis or edema Pulses: 2+ and symmetric Skin: Skin color, texture, turgor normal. No rashes or lesions Neurologic: Grossly normal  EKG  not performed today  ASSESSMENT AND PLAN:   HYPERCHOLESTEROLEMIA History of hyperlipidemia not on statin therapy with lipid profile performed 09/08/2021 revealing total cholesterol 212, LDL 145 and HDL of 40.  Given his elevated coronary calcium score of 259 I am going to begin him on atorvastatin 40 mg a day and we will recheck a lipid liver profile in 3 months with a goal of LDL less than 70.  Essential hypertension History of essential hypertension a blood pressure  measured today at 142/88.  He is on amlodipine and hydrochlorothiazide.  Elevated coronary artery calcium score Coronary calcium score of 259 measured 07/10/2021 with calcium in the left main, LAD and right coronary artery.  He is very active and completely asymptomatic.  His most recent LDL was 145 not on statin therapy.  I am going to begin him on atorvastatin 40 mg a day with LDL goal of less than 70 for secondary prevention.     Lorretta Harp MD FACP,FACC,FAHA, [facility] 09/12/2021 11:13 AM

## 2021-09-12 NOTE — Assessment & Plan Note (Signed)
History of hyperlipidemia not on statin therapy with lipid profile performed 09/08/2021 revealing total cholesterol 212, LDL 145 and HDL of 40.  Given his elevated coronary calcium score of 259 I am going to begin him on atorvastatin 40 mg a day and we will recheck a lipid liver profile in 3 months with a goal of LDL less than 70.

## 2021-09-12 NOTE — Patient Instructions (Signed)
Medication Instructions:   -Start atorvastatin (lipitor) '40mg'$  once daily.   *If you need a refill on your cardiac medications before your next appointment, please call your pharmacy*   Lab Work: Your physician recommends that you return for lab work in: 3 months for FASTING lipid/liver profile.  If you have labs (blood work) drawn today and your tests are completely normal, you will receive your results only by: [place] (if you have MyChart) OR A paper copy in the mail If you have any lab test that is abnormal or we need to change your treatment, we will call you to review the results.   Follow-Up: At [facility], you and your health needs are our priority.  As part of our continuing mission to provide you with exceptional heart care, we have created designated Provider Care Teams.  These Care Teams include your primary Cardiologist (physician) and Advanced Practice Providers (APPs -  Physician Assistants and Nurse Practitioners) who all work together to provide you with the care you need, when you need it.  We recommend signing up for the patient portal called "MyChart".  Sign up information is provided on this After Visit Summary.  MyChart is used to connect with patients for Virtual Visits (Telemedicine).  Patients are able to view lab/test results, encounter notes, upcoming appointments, etc.  Non-urgent messages can be sent to your provider as well.   To learn more about what you can do with MyChart, go to [email or web address].    Your next appointment:   12 month(s)  The format for your next appointment:   In Person  Provider:   Quay Burow, MD

## 2021-09-12 NOTE — Assessment & Plan Note (Signed)
History of essential hypertension a blood pressure measured today at 142/88.  He is on amlodipine and hydrochlorothiazide.

## 2021-09-26 ENCOUNTER — Encounter: Payer: Self-pay | Admitting: Cardiovascular Disease

## 2021-09-29 ENCOUNTER — Other Ambulatory Visit: Payer: Self-pay

## 2021-09-29 DIAGNOSIS — E7800 Pure hypercholesterolemia, unspecified: Secondary | ICD-10-CM

## 2021-10-13 ENCOUNTER — Ambulatory Visit (INDEPENDENT_AMBULATORY_CARE_PROVIDER_SITE_OTHER): Payer: Medicare Other | Admitting: Pharmacist Clinician (PhC)/ Clinical Pharmacy Specialist

## 2021-10-13 DIAGNOSIS — E7800 Pure hypercholesterolemia, unspecified: Secondary | ICD-10-CM

## 2021-10-13 NOTE — Progress Notes (Signed)
10/16/2021 David Villanueva 10-05-1950 [id number]   HPI:  David Villanueva is a 71 y.o. male patient of Dr Gwenlyn Found, who presents today for a lipid clinic evaluation.  His medical history is significant for CAD (calcium score of 259 - 64th percentile) and controlled hypertension.   He is in the office today with his wife, also a patient of Dr. Gwenlyn Found.  He was seen by Dr. Gwenlyn Found last month and noted to have an elevated calcium score, although no history of MI or stroke.    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 90 tablet 3   Ascorbic Acid (VITAMIN C) 1000 MG tablet Take 1,000 mg by mouth daily.     aspirin EC 81 MG tablet Take 81 mg by mouth daily. Swallow whole.     Barberry-Oreg Grape-Goldenseal (BERBERINE COMPLEX PO) Take 1 tablet by mouth daily.     Cholecalciferol (DIALYVITE VITAMIN D 5000) 125 MCG (5000 UT) capsule Take 5,000 Units by mouth daily.     hydrochlorothiazide (HYDRODIURIL) 25 MG tablet Take 1 tablet (25 mg total) by mouth daily. 90 tablet 3   magnesium oxide (MAG-OX) 400 (240 Mg) MG tablet Take 400 mg by mouth daily.     Probiotic Product (PROBIOTIC & ACIDOPHILUS EX ST PO) Take 1 tablet by mouth in the morning and at bedtime.     TURMERIC CURCUMIN PO Take 2 tablets by mouth daily.     vitamin B-12  (CYANOCOBALAMIN) 1000 MCG tablet Take 1,000 mcg by mouth daily.     No current facility-administered medications for this visit.    Allergies  Allergen Reactions   Meperidine Hcl     REACTION: itching    Past Medical History:  Diagnosis Date   Fatty liver    Gallstones    Headache(784.0)    prev history, extensive w/u with no clear diagnosis   history of CT of pelvis 11/10/2001   normal   Hyperlipidemia    Hypertension    Liver cyst    Prostatitis 10/2001   Prostate abscess (Dr. Gaynelle Arabian)    There were no vitals taken for this visit.   HYPERCHOLESTEROLEMIA Patient with hyperlipidemia and elevated calcium score.  Failed trial of atorvastatin 40 mg secondary to myalgias.  Reviewed information on calcium scoring and it's relation to hyperlipidemia.  His wife follows a vegan diet and he would like to transition to this for 6 months to see what improvement he can get in LDL cholesterol lowering.  Will repeat labs in 6 months and decide at that time if he wishes to pursue further pharmacologic treatment.     Tommy Medal PharmD CPP [facility] [address] [place], [place] [address] [phone]

## 2021-10-16 ENCOUNTER — Encounter: Payer: Self-pay | Admitting: Pharmacist Clinician (PhC)/ Clinical Pharmacy Specialist

## 2021-10-18 DIAGNOSIS — Z85828 Personal history of other malignant neoplasm of skin: Secondary | ICD-10-CM | POA: Insufficient documentation

## 2021-10-18 DIAGNOSIS — E785 Hyperlipidemia, unspecified: Secondary | ICD-10-CM | POA: Insufficient documentation

## 2021-10-18 DIAGNOSIS — K429 Umbilical hernia without obstruction or gangrene: Secondary | ICD-10-CM | POA: Insufficient documentation

## 2021-10-21 IMAGING — XA DG FLUORO GUIDE NDL PLC/BX
1 series · 1 of 1 positions shown · IV contrast (multihance)
Comparison: none

CLINICAL DATA: Right shoulder pain

EXAM:
RIGHT SHOULDER INJECTION UNDER FLUOROSCOPY
TECHNIQUE: An appropriate skin entrance site was determined. The site was
marked, prepped with Betadine, draped in the usual sterile fashion,
and infiltrated locally with 1% lidocaine. A 22 gauge spinal needle
was advanced to the superomedial margin of the humeral head under
intermittent fluoroscopy. 1 mL of 1% lidocaine injected easily. A
mixture of 0.1 mL of MultiHance, 15 mL of Isovue-M 200, and 5 mL of
sterile saline was then used to opacify the right shoulder capsule.
12 mL of this mixture were injected. No immediate complication.
FLUOROSCOPY TIME:  Fluoroscopy Time:  7 seconds
Radiation Exposure Index (if provided by the fluoroscopic device):
5.78 microGray*m^2
Number of Acquired Spot Images: 0

[Series 3: ortho standard · 1 of 1 slices shown]
[im 1/1]
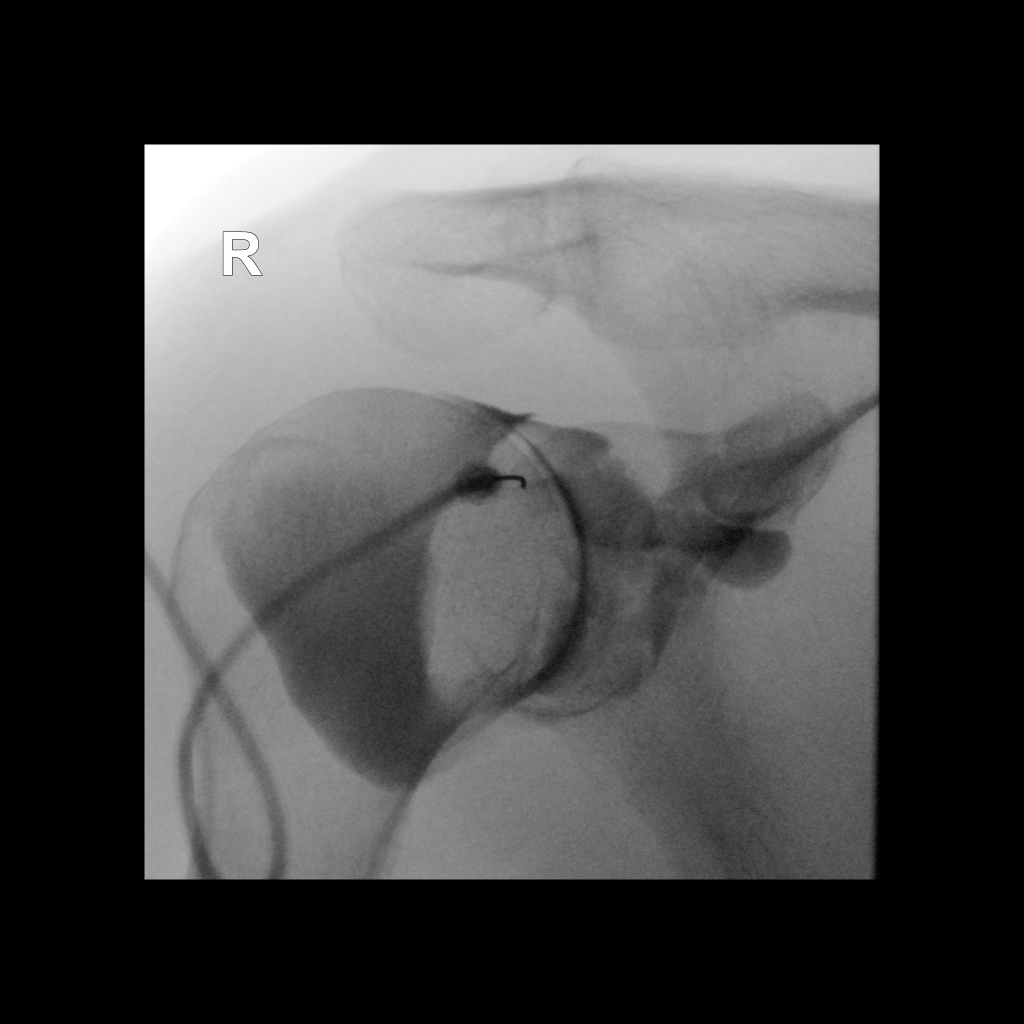

[1 of 1 positions shown; findings below may reference images not displayed]

IMPRESSION: Technically successful right shoulder injection for MRI.

## 2021-11-03 ENCOUNTER — Other Ambulatory Visit: Payer: BLUE CROSS/BLUE SHIELD

## 2022-01-18 ENCOUNTER — Encounter: Payer: Self-pay | Admitting: Gastroenterology

## 2022-02-20 DIAGNOSIS — S3402XA Concussion and edema of sacral spinal cord, initial encounter: Secondary | ICD-10-CM | POA: Insufficient documentation

## 2022-02-22 ENCOUNTER — Encounter: Payer: Self-pay | Admitting: Pharmacist Clinician (PhC)/ Clinical Pharmacy Specialist

## 2022-03-08 MED ORDER — AMLODIPINE BESYLATE 10 MG PO TABS: 10.0000 mg | ORAL_TABLET | Freq: Every day | ORAL | 3 refills | Status: DC

## 2022-04-23 HISTORY — PX: COLONOSCOPY: SHX174

## 2022-04-27 ENCOUNTER — Ambulatory Visit: Payer: BLUE CROSS/BLUE SHIELD | Admitting: General Practice

## 2022-05-28 ENCOUNTER — Ambulatory Visit: Payer: BLUE CROSS/BLUE SHIELD

## 2022-05-29 ENCOUNTER — Encounter: Payer: Self-pay | Admitting: Cardiovascular Disease
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
## 2022-06-01 ENCOUNTER — Encounter: Payer: Self-pay | Admitting: Adult Health

## 2022-06-01 ENCOUNTER — Ambulatory Visit: Payer: BLUE CROSS/BLUE SHIELD | Attending: Adult Health | Admitting: Adult Health

## 2022-06-01 VITALS — BP 132/86 | HR 77 | Ht 70.0 in | Wt 229.0 lb

## 2022-06-01 DIAGNOSIS — I10 Essential (primary) hypertension: Secondary | ICD-10-CM

## 2022-06-01 DIAGNOSIS — E7800 Pure hypercholesterolemia, unspecified: Secondary | ICD-10-CM

## 2022-06-01 DIAGNOSIS — M7918 Myalgia, other site: Secondary | ICD-10-CM

## 2022-06-01 DIAGNOSIS — R931 Abnormal findings on diagnostic imaging of heart and coronary circulation: Secondary | ICD-10-CM | POA: Diagnosis not present

## 2022-06-01 DIAGNOSIS — G8929 Other chronic pain: Secondary | ICD-10-CM

## 2022-06-01 MED ORDER — AMLODIPINE BESYLATE 10 MG PO TABS: 10.0000 mg | ORAL_TABLET | Freq: Every day | ORAL | 3 refills | Status: DC

## 2022-06-01 MED ORDER — HYDROCHLOROTHIAZIDE 25 MG PO TABS: 25.0000 mg | ORAL_TABLET | Freq: Every day | ORAL | 3 refills | Status: DC

## 2022-06-01 NOTE — Patient Instructions (Signed)
Medication Instructions:  No Changes *If you need a refill on your cardiac medications before your next appointment, please call your pharmacy*   Lab Work: No Labs If you have labs (blood work) drawn today and your tests are completely normal, you will receive your results only by: [place] (if you have MyChart) OR A paper copy in the mail If you have any lab test that is abnormal or we need to change your treatment, we will call you to review the results.   Testing/Procedures: No Testing   Follow-Up: At [facility], you and your health needs are our priority.  As part of our continuing mission to provide you with exceptional heart care, we have created designated Provider Care Teams.  These Care Teams include your primary Cardiologist (physician) and Advanced Practice Providers (APPs -  Physician Assistants and Nurse Practitioners) who all work together to provide you with the care you need, when you need it.  We recommend signing up for the patient portal called "MyChart".  Sign up information is provided on this After Visit Summary.  MyChart is used to connect with patients for Virtual Visits (Telemedicine).  Patients are able to view lab/test results, encounter notes, upcoming appointments, etc.  Non-urgent messages can be sent to your provider as well.   To learn more about what you can do with MyChart, go to [email or web address].    Your next appointment:   Keep Scheduled Appointment  Provider:   Coletta Memos, FNP

## 2022-06-05 ENCOUNTER — Encounter: Payer: Self-pay | Admitting: Pharmacist Clinician (PhC)/ Clinical Pharmacy Specialist

## 2022-06-08 ENCOUNTER — Ambulatory Visit: Payer: BLUE CROSS/BLUE SHIELD

## 2022-07-06 NOTE — Progress Notes (Deleted)
 " Cardiology Office Note:    Date:  07/06/2022   ID:  David Villanueva, DOB Jul 28, 1950, MRN [id number]  PCP:  Patient, No Pcp Per   [facility] Providers Cardiologist:  None { Click to update primary MD,subspecialty MD or APP then REFRESH:1}    Referring MD: No ref. provider found   No chief complaint on file. ***  History of Present Illness:    David Villanueva is a 72 y.o. male with a hx of hypertension, elevated coronary artery calcium  score, hyperlipidemia.  Previously was seen by Dr. Court around 2010, appears to be related to blood pressure control.  He reestablish care with Dr. Mona in 2016 again for blood pressure control. He had a coronary calcium  score on 07/10/21 which was elevated at 259, placing him in the 64th percentile for age, sex and race matched controls. He was without angina or decompensation.   Most recently evaluated with our practice on 06/01/2022, he had recently been started on atorvastatin  and was following up with the lipid clinic however he was unable to tolerate due to myalgia's. He was followed with pharmD in the lipid clinic, however the patient wanted to try to change his diet and lifestyle and repeat FLP in 6 months.   Last FLP was 09/08/21  Past Medical History:  Diagnosis Date   Fatty liver    Gallstones    Headache(784.0)    prev history, extensive w/u with no clear diagnosis   history of CT of pelvis 11/10/2001   normal   Hyperlipidemia    Hypertension    Liver cyst    Prostatitis 10/2001   Prostate abscess (Dr. Chales)    Past Surgical History:  Procedure Laterality Date   ACROMIO-CLAVICULAR JOINT REPAIR     Carotid Doppler  07/2000   Negative, also MRA negative   MRI//MRA Brain  2002   Negative   TEMPORAL ARTERY BIOPSY / LIGATION  2002   Negative   TONSILLECTOMY  1972   VASECTOMY      Current Medications: No outpatient medications have been marked as taking for the 07/09/22 encounter (Appointment) with  Emelia Josefa HERO, NP.     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    Employer: self employed     Comment: Scientist, Research (physical Sciences)   Tobacco Use   Smoking status: Never   Smokeless tobacco: Never  Substance and Sexual Activity   Alcohol use: No    Alcohol/week: 0.0 standard drinks of alcohol   Drug use: No   Sexual activity: Not on file  Other Topics Concern   Not on file  Social History Narrative   Married 1987 and lives with wife   1 daughter and 1 son   Social Determinants of Health   Financial Resource Strain: Not on file  Food Insecurity: Not on file  Transportation Needs: Not on file  Physical Activity: Not on file  Stress: Not on file  Social Connections: Not on file     Family History: The patient's ***family history includes Colon cancer in his maternal aunt and maternal grandfather; Heart attack in his father; Heart disease in his father; Hypertension in his paternal grandmother; Stroke in his mother and paternal grandfather. There  is no history of Prostate cancer.  ROS:   Please see the history of present illness.    *** All other systems reviewed and are negative.  EKGs/Labs/Other Studies Reviewed:    The following studies were reviewed today: ***  EKG:  EKG is *** ordered today.  The ekg ordered today demonstrates ***  Recent Labs: No results found for requested labs within last 365 days.  Recent Lipid Panel    Component Value Date/Time   CHOL 212 (H) 09/08/2021 0813   TRIG 148 09/08/2021 0813   HDL 40 09/08/2021 0813   CHOLHDL 5.3 (H) 09/08/2021 0813   CHOLHDL 4 11/25/2018 0845   VLDL 24.4 11/25/2018 0845   LDLCALC 145 (H) 09/08/2021 0813   LDLDIRECT 160.2 04/06/2013 0927      Risk Assessment/Calculations:   {Does this patient have ATRIAL FIBRILLATION?:[id number]}  No BP recorded.  {Refresh Note OR Click here to enter BP  :1}***         Physical Exam:    VS:  There were no vitals taken for this visit.    Wt Readings from Last 3 Encounters:  06/01/22 229 lb (103.9 kg)  09/12/21 222 lb 9.6 oz (101 kg)  05/17/21 217 lb 12.8 oz (98.8 kg)     GEN: *** Well nourished, well developed in no acute distress HEENT: Normal NECK: No JVD; No carotid bruits LYMPHATICS: No lymphadenopathy CARDIAC: ***RRR, no murmurs, rubs, gallops RESPIRATORY:  Clear to auscultation without rales, wheezing or rhonchi  ABDOMEN: Soft, non-tender, non-distended MUSCULOSKELETAL:  No edema; No deformity  SKIN: Warm and dry NEUROLOGIC:  Alert and oriented x 3 PSYCHIATRIC:  Normal affect   ASSESSMENT:    1. Essential hypertension   2. Hypercholesterolemia   3. Elevated coronary artery calcium  score    PLAN:    In order of problems listed above:  HTN HLD Elevated coronary calcium  score      {Are you ordering a CV Procedure (e.g. stress test, cath, DCCV, TEE, etc)?   Press F2        :[id number]}    Medication Adjustments/Labs and Tests Ordered: Current medicines are reviewed at length with the patient today.  Concerns regarding medicines are outlined above.  No orders of the defined types were placed in this encounter.  No orders of the defined types were placed in this encounter.   There are no Patient Instructions on file for this visit.   Signed, Delon JAYSON Hoover, NP  07/06/2022 4:46 PM    [facility]CareThis encounter was created in error - please disregard. This encounter was created in error - please disregard. "

## 2022-07-09 ENCOUNTER — Ambulatory Visit (INDEPENDENT_AMBULATORY_CARE_PROVIDER_SITE_OTHER): Payer: Medicare Other

## 2022-07-09 ENCOUNTER — Encounter: Payer: Self-pay | Admitting: Pharmacist Clinician (PhC)/ Clinical Pharmacy Specialist

## 2022-07-09 ENCOUNTER — Ambulatory Visit: Payer: Medicare Other | Attending: General Practice | Admitting: General Practice

## 2022-07-09 VITALS — BP 150/97 | HR 58

## 2022-07-09 DIAGNOSIS — E7800 Pure hypercholesterolemia, unspecified: Secondary | ICD-10-CM

## 2022-07-09 DIAGNOSIS — I10 Essential (primary) hypertension: Secondary | ICD-10-CM | POA: Diagnosis present

## 2022-07-09 DIAGNOSIS — R931 Abnormal findings on diagnostic imaging of heart and coronary circulation: Secondary | ICD-10-CM | POA: Diagnosis present

## 2022-07-09 MED ORDER — VALSARTAN 160 MG PO TABS: 160.0000 mg | ORAL_TABLET | Freq: Every day | ORAL | 3 refills | Status: DC

## 2022-07-09 NOTE — Patient Instructions (Signed)
Follow up appointment: with Dr. Gwenlyn Found in 2-3 months  Go to the lab in 2 weeks to check kidney function  Take your BP meds as follows:  start valsartan 160 mg once daily in the evenings.   Continue with your other medications  Check your blood pressure at home daily (if able) and keep record of the readings.  Hypertension "High blood pressure"  Hypertension is often called "The Silent Killer." It rarely causes symptoms until it is extremely  high or has done damage to other organs in the body. For this reason, you should have your  blood pressure checked regularly by your physician. We will check your blood pressure  every time you see a provider at one of our offices.   Your blood pressure reading consists of two numbers. Ideally, blood pressure should be  below 120/80. The first ("top") number is called the systolic pressure. It measures the  pressure in your arteries as your heart beats. The second ("bottom") number is called the diastolic pressure. It measures the pressure in your arteries as the heart relaxes between beats.  The benefits of getting your blood pressure under control are enormous. A 10-point  reduction in systolic blood pressure can reduce your risk of stroke by 27% and heart failure by 28%  Your blood pressure goal is <v130/80  To check your pressure at home you will need to:  1. Sit up in a chair, with feet flat on the floor and back supported. Do not cross your ankles or legs. 2. Rest your left arm so that the cuff is about heart level. If the cuff goes on your upper arm,  then just relax the arm on the table, arm of the chair or your lap. If you have a wrist cuff, we  suggest relaxing your wrist against your chest (think of it as Pledging the Flag with the  wrong arm).  3. Place the cuff snugly around your arm, about 1 inch above the crook of your elbow. The  cords should be inside the groove of your elbow.  4. Sit quietly, with the cuff in place, for  about 5 minutes. After that 5 minutes press the power  button to start a reading. 5. Do not talk or move while the reading is taking place.  6. Record your readings on a sheet of paper. Although most cuffs have a memory, it is often  easier to see a pattern developing when the numbers are all in front of you.  7. You can repeat the reading after 1-3 minutes if it is recommended  Make sure your bladder is empty and you have not had caffeine or tobacco within the last 30 min  Always bring your blood pressure log with you to your appointments. If you have not brought your monitor in to be double checked for accuracy, please bring it to your next appointment.  You can find a list of quality blood pressure cuffs at validatebp.org
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
## 2022-07-10 ENCOUNTER — Encounter: Payer: Self-pay | Admitting: Pharmacist Clinician (PhC)/ Clinical Pharmacy Specialist

## 2022-07-10 MED ORDER — VALSARTAN 160 MG PO TABS: 160.0000 mg | ORAL_TABLET | Freq: Every day | ORAL | 3 refills | Status: DC

## 2022-07-15 ENCOUNTER — Encounter: Payer: Self-pay | Admitting: Pharmacist Clinician (PhC)/ Clinical Pharmacy Specialist

## 2022-07-15 DIAGNOSIS — I10 Essential (primary) hypertension: Secondary | ICD-10-CM

## 2022-08-08 ENCOUNTER — Encounter: Payer: Self-pay | Admitting: Pharmacist Clinician (PhC)/ Clinical Pharmacy Specialist

## 2022-08-08 ENCOUNTER — Ambulatory Visit
Payer: Medicare Other | Attending: Cardiovascular Disease | Admitting: Pharmacist Clinician (PhC)/ Clinical Pharmacy Specialist

## 2022-08-08 VITALS — BP 130/81

## 2022-08-08 DIAGNOSIS — I10 Essential (primary) hypertension: Secondary | ICD-10-CM | POA: Diagnosis present

## 2022-08-08 NOTE — Assessment & Plan Note (Signed)
Assessment: BP is controlled in office BP 130/81 mmHg; Home readings still elevated, but improved from last visit Tolerates current medications well without any side effects Denies SOB, palpitation, chest pain, headaches,or swelling Reiterated the importance of regular exercise and low salt diet   Plan:  Continue taking current medications Patient to keep record of BP readings with heart rate and report to [place] at the next visit Patient to follow up with Dr. Allyson Sabal in 2 months  Labs ordered today:  none
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
## 2022-08-08 NOTE — Patient Instructions (Signed)
Follow up appointment: with Dr. Allyson Sabal in June  Go to the lab to get the metanephrines test  Take your BP meds as follows: no change to current medications  Check your blood pressure at home daily (if able) and keep record of the readings.  Hypertension "High blood pressure"  Hypertension is often called "The Silent Killer." It rarely causes symptoms until it is extremely  high or has done damage to other organs in the body. For this reason, you should have your  blood pressure checked regularly by your physician. We will check your blood pressure  every time you see a provider at one of our offices.   Your blood pressure reading consists of two numbers. Ideally, blood pressure should be  below 120/80. The first ("top") number is called the systolic pressure. It measures the  pressure in your arteries as your heart beats. The second ("bottom") number is called the diastolic pressure. It measures the pressure in your arteries as the heart relaxes between beats.  The benefits of getting your blood pressure under control are enormous. A 10-point  reduction in systolic blood pressure can reduce your risk of stroke by 27% and heart failure by 28%  Your blood pressure goal is < 130/80  To check your pressure at home you will need to:  1. Sit up in a chair, with feet flat on the floor and back supported. Do not cross your ankles or legs. 2. Rest your left arm so that the cuff is about heart level. If the cuff goes on your upper arm,  then just relax the arm on the table, arm of the chair or your lap. If you have a wrist cuff, we  suggest relaxing your wrist against your chest (think of it as Pledging the Flag with the  wrong arm).  3. Place the cuff snugly around your arm, about 1 inch above the crook of your elbow. The  cords should be inside the groove of your elbow.  4. Sit quietly, with the cuff in place, for about 5 minutes. After that 5 minutes press the power  button to start a  reading. 5. Do not talk or move while the reading is taking place.  6. Record your readings on a sheet of paper. Although most cuffs have a memory, it is often  easier to see a pattern developing when the numbers are all in front of you.  7. You can repeat the reading after 1-3 minutes if it is recommended  Make sure your bladder is empty and you have not had caffeine or tobacco within the last 30 min  Always bring your blood pressure log with you to your appointments. If you have not brought your monitor in to be double checked for accuracy, please bring it to your next appointment.  You can find a list of quality blood pressure cuffs at validatebp.org

## 2022-09-28 ENCOUNTER — Ambulatory Visit: Payer: Medicare Other | Attending: Cardiovascular Disease | Admitting: Cardiovascular Disease

## 2022-09-28 ENCOUNTER — Encounter: Payer: Self-pay | Admitting: Cardiovascular Disease

## 2022-09-28 VITALS — BP 132/64 | HR 57 | Ht 70.0 in | Wt 221.2 lb

## 2022-09-28 DIAGNOSIS — I10 Essential (primary) hypertension: Secondary | ICD-10-CM | POA: Insufficient documentation

## 2022-09-28 DIAGNOSIS — E7800 Pure hypercholesterolemia, unspecified: Secondary | ICD-10-CM | POA: Insufficient documentation

## 2022-09-28 DIAGNOSIS — R931 Abnormal findings on diagnostic imaging of heart and coronary circulation: Secondary | ICD-10-CM | POA: Diagnosis present

## 2022-09-28 NOTE — Assessment & Plan Note (Signed)
Elevated coronary calcium score of 259 mostly in the LAD and RCA territories performed 07/10/2021.  He is completely asymptomatic.  His LDL in the past was 145.  Will pursue PCSK9 therapy for LDL goal less than 70.
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
## 2022-09-28 NOTE — Assessment & Plan Note (Addendum)
History of hyperlipidemia not on statin therapy because of statin intolerance with lipid profile performed 09/08/2021 revealing total cholesterol of 212, LDL 145 and HDL 40.  A more recent lipid profile performed 09/04/2022 revealed total cholesterol 210, LDL 117 and HDL of 42.  His LDL goal is less than 70.  He may be a candidate for a PCSK9.

## 2022-09-28 NOTE — Patient Instructions (Signed)
Medication Instructions:  Your physician recommends that you continue on your current medications as directed. Please refer to the Current Medication list given to you today.  *If you need a refill on your cardiac medications before your next appointment, please call your pharmacy*   Follow-Up: At [facility], you and your health needs are our priority.  As part of our continuing mission to provide you with exceptional heart care, we have created designated Provider Care Teams.  These Care Teams include your primary Cardiologist (physician) and Advanced Practice Providers (APPs -  Physician Assistants and Nurse Practitioners) who all work together to provide you with the care you need, when you need it.  We recommend signing up for the patient portal called "MyChart".  Sign up information is provided on this After Visit Summary.  MyChart is used to connect with patients for Virtual Visits (Telemedicine).  Patients are able to view lab/test results, encounter notes, upcoming appointments, etc.  Non-urgent messages can be sent to your provider as well.   To learn more about what you can do with MyChart, go to [email or web address].    Your next appointment:   6 month(s)  Provider:   Kathryn Lawrence, DNP, ANP       Then, Jonathan Berry, MD will plan to see you again in 12 month(s).   

## 2022-09-28 NOTE — Assessment & Plan Note (Signed)
History of essential hypertension blood pressure measured today at 132/64.  He is on amlodipine, hydrochlorothiazide and valsartan.  I did look at a blood pressure log which she provided that showed his blood pressure mostly within normal range.

## 2022-10-02 LAB — CATECHOLAMINES, FRACTIONATED, PLASMA
Dopamine: <30 pg/mL (ref 0–48)
Epinephrine: 27 pg/mL (ref 0–62)
Norepinephrine: 716 pg/mL (ref 0–874)

## 2022-10-02 LAB — METANEPHRINES, PLASMA
Metanephrine, Free: 34.1 pg/mL (ref 0.0–88.0)
Normetanephrine, Free: 102.7 pg/mL (ref 0.0–285.2)

## 2023-01-09 ENCOUNTER — Ambulatory Visit: Payer: Medicare Other | Attending: Cardiovascular Disease | Admitting: Pharmacist

## 2023-01-09 ENCOUNTER — Other Ambulatory Visit (HOSPITAL_COMMUNITY): Payer: Self-pay

## 2023-01-09 ENCOUNTER — Telehealth: Payer: Self-pay | Admitting: Pharmacist

## 2023-01-09 ENCOUNTER — Telehealth: Payer: Self-pay | Admitting: Pharmacy Technician

## 2023-01-09 ENCOUNTER — Encounter: Payer: Self-pay | Admitting: Pharmacist

## 2023-01-09 DIAGNOSIS — E7800 Pure hypercholesterolemia, unspecified: Secondary | ICD-10-CM

## 2023-01-09 DIAGNOSIS — S3402XS Concussion and edema of sacral spinal cord, sequela: Secondary | ICD-10-CM | POA: Insufficient documentation

## 2023-01-09 DIAGNOSIS — R931 Abnormal findings on diagnostic imaging of heart and coronary circulation: Secondary | ICD-10-CM

## 2023-01-09 MED ORDER — REPATHA SURECLICK 140 MG/ML ~~LOC~~ SOAJ: 1.0000 mL | SUBCUTANEOUS | 11 refills | Status: DC

## 2023-01-09 NOTE — Telephone Encounter (Signed)
Pharmacy Patient Advocate Encounter  Received notification from [employer] that Prior Authorization for repatha has been APPROVED from 01/09/23 to 04/23/23   PA #/Case ID/Reference #: [id number]

## 2023-01-09 NOTE — Addendum Note (Signed)
Addended by: Cheree Ditto on: 01/09/2023 02:32 PM   Modules accepted: Orders

## 2023-01-09 NOTE — Telephone Encounter (Signed)
PA request has been Submitted. New Encounter created for follow up. For additional info see Pharmacy Prior Auth telephone encounter from 01/09/23.

## 2023-01-09 NOTE — Telephone Encounter (Signed)
Please complete PA for Repatha
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
## 2023-01-09 NOTE — Telephone Encounter (Signed)
Pharmacy Patient Advocate Encounter   Received notification from Pt Calls Messages that prior authorization for repatha is required/requested.   Insurance verification completed.   The patient is insured through [employer] .   Per test claim: PA required; PA submitted to [employer] via CoverMyMeds Key/confirmation #/EOC [id number] Status is pending

## 2023-01-09 NOTE — Patient Instructions (Signed)
It was nice seeing you today  We would like your LDL (bad cholesterol) to be less than 70  The medication we discussed today is called Repatha, which you would inject once every 2 weeks  I will complete the prior authorization for you and contact you when it is approved  Once you start the medication, we will recheck your cholesterol in about 3 months  I have also placed the endocrinology referral for you  Let us know with any questions  Laural Golden, PharmD, BCACP, CDCES, CPP [address] [place], [place], [address] Phone: [phone], Fax: [phone]

## 2023-01-17 LAB — LIPID PANEL
Chol/HDL Ratio: 4.8 ratio (ref 0.0–5.0)
Cholesterol, Total: 203 mg/dL — ABNORMAL HIGH (ref 100–199)
HDL: 42 mg/dL (ref >39)
LDL Chol Calc (NIH): 127 mg/dL — ABNORMAL HIGH (ref 0–99)
Triglycerides: 193 mg/dL — ABNORMAL HIGH (ref 0–149)
VLDL Cholesterol Cal: 34 mg/dL (ref 5–40)

## 2023-02-19 ENCOUNTER — Encounter: Payer: Self-pay | Admitting: Cardiovascular Disease

## 2023-03-25 ENCOUNTER — Encounter: Payer: Self-pay | Admitting: Gastroenterology

## 2023-03-29 ENCOUNTER — Encounter: Payer: Self-pay | Admitting: Physician Assistant

## 2023-03-29 ENCOUNTER — Ambulatory Visit: Payer: Medicare Other | Attending: Physician Assistant | Admitting: Physician Assistant

## 2023-03-29 VITALS — BP 130/80 | HR 61 | Ht 70.0 in | Wt 228.0 lb

## 2023-03-29 DIAGNOSIS — Z79899 Other long term (current) drug therapy: Secondary | ICD-10-CM | POA: Diagnosis present

## 2023-03-29 DIAGNOSIS — I10 Essential (primary) hypertension: Secondary | ICD-10-CM

## 2023-03-29 DIAGNOSIS — R600 Localized edema: Secondary | ICD-10-CM | POA: Diagnosis present

## 2023-03-29 DIAGNOSIS — E7800 Pure hypercholesterolemia, unspecified: Secondary | ICD-10-CM

## 2023-03-29 DIAGNOSIS — R931 Abnormal findings on diagnostic imaging of heart and coronary circulation: Secondary | ICD-10-CM | POA: Diagnosis not present

## 2023-03-29 MED ORDER — REPATHA SURECLICK 140 MG/ML ~~LOC~~ SOAJ: 1.0000 mL | SUBCUTANEOUS | 11 refills | Status: DC

## 2023-03-29 MED ORDER — VALSARTAN 320 MG PO TABS: 320.0000 mg | ORAL_TABLET | Freq: Every day | ORAL | 3 refills | Status: DC
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
## 2023-03-29 NOTE — Patient Instructions (Signed)
Medication Instructions:  INCREASE VALSARTAN 320 MG DAILY AT NIGHT *If you need a refill on your cardiac medications before your next appointment, please call your pharmacy*   Lab Work: FASTING LIPIDS AND CMP IN EARLY JANUARY If you have labs (blood work) drawn today and your tests are completely normal, you will receive your results only by: MyChart Message (if you have MyChart) OR A paper copy in the mail If you have any lab test that is abnormal or we need to change your treatment, we will call you to review the results.   Testing/Procedures:[address] Your physician has requested that you have an echocardiogram. Echocardiography is a painless test that uses sound waves to create images of your heart. It provides your doctor with information about the size and shape of your heart and how well your heart's chambers and valves are working. This procedure takes approximately one hour. There are no restrictions for this procedure. Please do NOT wear cologne, perfume, aftershave, or lotions (deodorant is allowed). Please arrive 15 minutes prior to your appointment time.  Please note: We ask at that you not bring children with you during ultrasound (echo/ vascular) testing. Due to room size and safety concerns, children are not allowed in the ultrasound rooms during exams. Our front office staff cannot provide observation of children in our lobby area while testing is being conducted. An adult accompanying a patient to their appointment will only be allowed in the ultrasound room at the discretion of the ultrasound technician under special circumstances. We apologize for any inconvenience.    Follow-Up: At [facility], you and your health needs are our priority.  As part of our continuing mission to provide you with exceptional heart care, we have created designated Provider Care Teams.  These Care Teams include your primary Cardiologist (physician) and Advanced Practice  Providers (APPs -  Physician Assistants and Nurse Practitioners) who all work together to provide you with the care you need, when you need it.    Your next appointment:   6 week(s)  Provider:   Azalee Course, PA or Nanetta Batty, MD    Other Instructions PROVIDER RECOMMENDS LOW SODIUM DIET AND LEG ELEVATION FOR LEG SWELLING

## 2023-04-10 ENCOUNTER — Ambulatory Visit (AMBULATORY_SURGERY_CENTER): Payer: Medicare Other

## 2023-04-10 VITALS — Ht 70.0 in | Wt 217.0 lb

## 2023-04-10 DIAGNOSIS — Z800 Family history of malignant neoplasm of digestive organs: Secondary | ICD-10-CM

## 2023-04-10 MED ORDER — NA SULFATE-K SULFATE-MG SULF 17.5-3.13-1.6 GM/177ML PO SOLN: 1.0000 | Freq: Once | ORAL | 0 refills | Status: AC

## 2023-04-10 NOTE — Progress Notes (Signed)
Pt's name and DOB verified at the beginning of the pre-visit wit 2 identifiers  Pt denies any difficulty with ambulating,sitting, laying down or rolling side to side  Pt has no issues with ambulation   Pt has no issues moving head neck or swallowing  No egg or soy allergy known to patient   No issues known to pt with past sedation with any surgeries or procedures  Pt denies having issues being intubated  No FH of Malignant Hyperthermia  Pt is not on diet pills or shots  Pt is not on home 02   Pt is not on blood thinners   Pt denies issues with constipation   Pt is not on dialysis  Pt denise any abnormal heart rhythms   Pt denies any upcoming cardiac testing  Pt encouraged to use to use Singlecare or Goodrx to reduce cost   Patient's chart reviewed by Cathlyn Parsons CNRA prior to pre-visit and patient appropriate for the [facility].  Pre-visit completed and red dot placed by patient's name on their procedure day (on provider's schedule).  .  Visit by phone  Pt states weight is 217 lb  Instructed pt why it is important to and  to call if they have any changes in health or new medications. Directed them to the # given and on instructions.     Instructions reviewed. Pt given both [facility] main # and MD on call # prior to instructions.  Pt states understanding. Instructed to review again prior to procedure. Pt states they will.   Instructions sent by mail with coupon and by My Chart   Coupon sent via text to mobile phone and pt verified they received it

## 2023-04-29 NOTE — Progress Notes (Signed)
 Error

## 2023-05-02 ENCOUNTER — Ambulatory Visit (HOSPITAL_COMMUNITY): Payer: Medicare Other | Attending: Cardiology

## 2023-05-02 DIAGNOSIS — R600 Localized edema: Secondary | ICD-10-CM | POA: Insufficient documentation

## 2023-05-02 LAB — ECHOCARDIOGRAM COMPLETE
Area-P 1/2: 5.06 cm2
S' Lateral: 2.4 cm

## 2023-05-06 ENCOUNTER — Telehealth: Payer: Self-pay | Admitting: Gastroenterology

## 2023-05-07 ENCOUNTER — Ambulatory Visit: Payer: Medicare Other | Admitting: Gastroenterology

## 2023-05-07 ENCOUNTER — Encounter: Payer: Self-pay | Admitting: Gastroenterology

## 2023-05-07 VITALS — BP 94/64 | HR 60 | Temp 98.2°F | Resp 10 | Ht 70.0 in | Wt 217.0 lb

## 2023-05-07 DIAGNOSIS — D123 Benign neoplasm of transverse colon: Secondary | ICD-10-CM | POA: Diagnosis not present

## 2023-05-07 DIAGNOSIS — Z1211 Encounter for screening for malignant neoplasm of colon: Secondary | ICD-10-CM | POA: Diagnosis not present

## 2023-05-07 DIAGNOSIS — Z800 Family history of malignant neoplasm of digestive organs: Secondary | ICD-10-CM

## 2023-05-07 DIAGNOSIS — K6289 Other specified diseases of anus and rectum: Secondary | ICD-10-CM

## 2023-05-07 DIAGNOSIS — K5730 Diverticulosis of large intestine without perforation or abscess without bleeding: Secondary | ICD-10-CM

## 2023-05-07 DIAGNOSIS — K635 Polyp of colon: Secondary | ICD-10-CM | POA: Diagnosis not present

## 2023-05-07 DIAGNOSIS — D125 Benign neoplasm of sigmoid colon: Secondary | ICD-10-CM

## 2023-05-07 DIAGNOSIS — D120 Benign neoplasm of cecum: Secondary | ICD-10-CM

## 2023-05-07 MED ORDER — SODIUM CHLORIDE 0.9 % IV SOLN: 500.0000 mL | Freq: Once | INTRAVENOUS | Status: DC

## 2023-05-07 NOTE — Progress Notes (Signed)
 [facility] History and Physical   Primary Care Physician:  Patient, No Pcp Per   Reason for Procedure:   Colon cancer screening/history of colon polyps  Plan:    Colonoscopy     HPI: David Villanueva is a 73 y.o. male undergoing surveillance colonoscopy.  He has no family history of colon cancer and no chronic GI symptoms.   He had a single subcentimeter tubular adenoma removed in 2016.   Past Medical History:  Diagnosis Date   Arthritis    Fatty liver    Gallstones    Headache(784.0)    prev history, extensive w/u with no clear diagnosis   history of CT of pelvis 11/10/2001   normal   Hyperlipidemia    Hypertension    Liver cyst    Prostatitis 10/2001   Prostate abscess (Dr. Chales)    Past Surgical History:  Procedure Laterality Date   ACROMIO-CLAVICULAR JOINT REPAIR     Carotid Doppler  07/2000   Negative, also MRA negative   MRI//MRA Brain  2002   Negative   TEMPORAL ARTERY BIOPSY / LIGATION  2002   Negative   TONSILLECTOMY  1972   VASECTOMY      Prior to Admission medications   Medication Sig Start Date End Date Taking? Authorizing Provider  amLODipine  (NORVASC ) 10 MG tablet Take 1 tablet (10 mg total) by mouth daily. 06/01/22  Yes Jerilynn Lamarr HERO, NP  Ascorbic Acid (VITAMIN C) 1000 MG tablet Take 1,000 mg by mouth daily.   Yes [provider]  Barberry-Oreg Grape-Goldenseal (BERBERINE COMPLEX PO) Take 1 tablet by mouth daily.   Yes [provider]  Cholecalciferol (DIALYVITE VITAMIN D 5000) 125 MCG (5000 UT) capsule Take 5,000 Units by mouth daily.   Yes [provider]  hydrochlorothiazide  (HYDRODIURIL ) 25 MG tablet Take 1 tablet (25 mg total) by mouth daily. 06/01/22  Yes Jerilynn Lamarr HERO, NP  magnesium oxide (MAG-OX) 400 (240 Mg) MG tablet Take 400 mg by mouth daily.   Yes [provider]  Probiotic Product (PROBIOTIC & ACIDOPHILUS EX ST PO) Take 1 tablet by mouth in the morning and at bedtime.   Yes  [provider]  testosterone cypionate (DEPOTESTOSTERONE CYPIONATE) 200 MG/ML injection Inject 200 mg into the muscle. Every 5 days   Yes [provider]  TURMERIC CURCUMIN PO Take 2 tablets by mouth daily.   Yes [provider]  valsartan  (DIOVAN ) 320 MG tablet Take 1 tablet (320 mg total) by mouth at bedtime. 03/29/23  Yes Meng, Hao, PA  vitamin B-12 (CYANOCOBALAMIN) 1000 MCG tablet Take 1,000 mcg by mouth daily.   Yes [provider]  aspirin EC 81 MG tablet Take 81 mg by mouth daily. Swallow whole.    [provider]  Evolocumab  (REPATHA  SURECLICK) 140 MG/ML SOAJ Inject 140 mg into the skin every 14 (fourteen) days. 03/29/23   Meng, Hao, PA    Current Outpatient Medications  Medication Sig Dispense Refill   amLODipine  (NORVASC ) 10 MG tablet Take 1 tablet (10 mg total) by mouth daily. 90 tablet 3   Ascorbic Acid (VITAMIN C) 1000 MG tablet Take 1,000 mg by mouth daily.     Barberry-Oreg Grape-Goldenseal (BERBERINE COMPLEX PO) Take 1 tablet by mouth daily.     Cholecalciferol (DIALYVITE VITAMIN D 5000) 125 MCG (5000 UT) capsule Take 5,000 Units by mouth daily.     hydrochlorothiazide  (HYDRODIURIL ) 25 MG tablet Take 1 tablet (25 mg total) by mouth daily. 90 tablet 3  magnesium oxide (MAG-OX) 400 (240 Mg) MG tablet Take 400 mg by mouth daily.     Probiotic Product (PROBIOTIC & ACIDOPHILUS EX ST PO) Take 1 tablet by mouth in the morning and at bedtime.     testosterone cypionate (DEPOTESTOSTERONE CYPIONATE) 200 MG/ML injection Inject 200 mg into the muscle. Every 5 days     TURMERIC CURCUMIN PO Take 2 tablets by mouth daily.     valsartan  (DIOVAN ) 320 MG tablet Take 1 tablet (320 mg total) by mouth at bedtime. 90 tablet 3   vitamin B-12 (CYANOCOBALAMIN) 1000 MCG tablet Take 1,000 mcg by mouth daily.     aspirin EC 81 MG tablet Take 81 mg by mouth daily. Swallow whole.     Evolocumab  (REPATHA  SURECLICK) 140 MG/ML SOAJ Inject 140 mg into the skin every  14 (fourteen) days. 2 mL 11   No current facility-administered medications for this visit.    Allergies as of 05/07/2023 - Review Complete 05/07/2023  Allergen Reaction Noted   Atorvastatin  calcium  Other (See Comments) 01/09/2023   Meperidine hcl  07/08/2007    Family History  Problem Relation Age of Onset   Stroke Mother    Heart disease Father        open heart surgery//   Heart attack Father        multiple times   Colon cancer Maternal Aunt        dx'd late in life   Colon cancer Maternal Grandfather    Hypertension Paternal Grandmother        also hypertension    Stroke Paternal Grandfather        also cancer   Prostate cancer Neg Hx    Colon polyps Neg Hx    Esophageal cancer Neg Hx    Stomach cancer Neg Hx    Rectal cancer Neg Hx     Social History   Socioeconomic History   Marital status: Married    Spouse name: Not on file   Number of children: 2   Years of education: Master's   Highest education level: Not on file  Occupational History   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    Employer: self employed     Comment: Scientist, Research (physical Sciences)   Tobacco Use   Smoking status: Never   Smokeless tobacco: Never  Substance and Sexual Activity   Alcohol use: No    Alcohol/week: 0.0 standard drinks of alcohol   Drug use: No   Sexual activity: Not on file  Other Topics Concern   Not on file  Social History Narrative   Married 1987 and lives with wife   1 daughter and 1 son   Social Drivers of Corporate Investment Banker Strain: Not on file  Food Insecurity: Not on file  Transportation Needs: Not on file  Physical Activity: Not on file  Stress: Not on file  Social Connections: Not on file  Intimate Partner Violence: Not on file    Review of Systems:  All other review of systems negative except as mentioned in the HPI.  Physical Exam: Vital signs BP 125/69   Pulse 62   Temp 98.2 F (36.8 C) (Temporal)   Ht 5' 10 (1.778 m)   Wt 217  lb (98.4 kg)   SpO2 95%   BMI 31.14 kg/m   General:   Alert,  Well-developed, well-nourished, pleasant and cooperative in NAD Airway:  Mallampati 1 Lungs:  Clear throughout to auscultation.   Heart:  Regular rate and rhythm;  no murmurs, clicks, rubs,  or gallops. Abdomen:  Soft, nontender and nondistended. Normal bowel sounds.   Neuro/Psych:  Normal mood and affect. A and O x 3   Kaegan Stigler E. Stacia, MD [facility]

## 2023-05-07 NOTE — Progress Notes (Signed)
 Pt's states no medical or surgical changes since previsit or office visit.

## 2023-05-07 NOTE — Progress Notes (Signed)
 Called to room to assist during endoscopic procedure.  Patient ID and intended procedure confirmed with present staff. Received instructions for my participation in the procedure from the performing physician.

## 2023-05-07 NOTE — Progress Notes (Signed)
 A/O x 3, gd SR's, VSS, report to RN

## 2023-05-07 NOTE — Patient Instructions (Signed)
 Resume previous diet and medications. Awaiting pathology results. Repeat Colonoscopy date to be determined based on pathology results. Handouts provided on Colon polyps and Diverticulosis  YOU HAD AN ENDOSCOPIC PROCEDURE TODAY AT THE [facility]:   Refer to the procedure report that was given to you for any specific questions about what was found during the examination.  If the procedure report does not answer your questions, please call your gastroenterologist to clarify.  If you requested that your care partner not be given the details of your procedure findings, then the procedure report has been included in a sealed envelope for you to review at your convenience later.  YOU SHOULD EXPECT: Some feelings of bloating in the abdomen. Passage of more gas than usual.  Walking can help get rid of the air that was put into your GI tract during the procedure and reduce the bloating. If you had a lower endoscopy (such as a colonoscopy or flexible sigmoidoscopy) you may notice spotting of blood in your stool or on the toilet paper. If you underwent a bowel prep for your procedure, you may not have a normal bowel movement for a few days.  Please Note:  You might notice some irritation and congestion in your nose or some drainage.  This is from the oxygen used during your procedure.  There is no need for concern and it should clear up in a day or so.  SYMPTOMS TO REPORT IMMEDIATELY:  Following lower endoscopy (colonoscopy or flexible sigmoidoscopy):  Excessive amounts of blood in the stool  Significant tenderness or worsening of abdominal pains  Swelling of the abdomen that is new, acute  Fever of 100F or higher  Following upper endoscopy (EGD)  Vomiting of blood or coffee ground material  New chest pain or pain under the shoulder blades  Painful or persistently difficult swallowing  New shortness of breath  Fever of 100F or higher  Black, tarry-looking stools  For urgent or emergent  issues, a gastroenterologist can be reached at any hour by calling [phone]. Do not use MyChart messaging for urgent concerns.    DIET:  We do recommend a small meal at first, but then you may proceed to your regular diet.  Drink plenty of fluids but you should avoid alcoholic beverages for 24 hours.  ACTIVITY:  You should plan to take it easy for the rest of today and you should NOT DRIVE or use heavy machinery until tomorrow (because of the sedation medicines used during the test).    FOLLOW UP: Our staff will call the number listed on your records the next business day following your procedure.  We will call around 7:15- 8:00 am to check on you and address any questions or concerns that you may have regarding the information given to you following your procedure. If we do not reach you, we will leave a message.     If any biopsies were taken you will be contacted by phone or by letter within the next 1-3 weeks.  Please call us  at [phone] if you have not heard about the biopsies in 3 weeks.    SIGNATURES/CONFIDENTIALITY: You and/or your care partner have signed paperwork which will be entered into your electronic medical record.  These signatures attest to the fact that that the information above on your After Visit Summary has been reviewed and is understood.  Full responsibility of the confidentiality of this discharge information lies with you and/or your care-partner.

## 2023-05-07 NOTE — Op Note (Signed)
 [facility] Patient Name: David Villanueva Procedure Date: 05/07/2023 1:52 PM MRN: [id number] Endoscopist: Glendia E. Stacia , MD, [id number] Age: 73 Referring MD:  Date of Birth: Oct 25, 1950 Gender: Male Account #: [id number] Procedure:                Colonoscopy Indications:              Surveillance: Personal history of adenomatous                            polyps on last colonoscopy > 5 years ago Medicines:                Monitored Anesthesia Care Procedure:                Pre-Anesthesia Assessment:                           - Prior to the procedure, a History and Physical                            was performed, and patient medications and                            allergies were reviewed. The patient's tolerance of                            previous anesthesia was also reviewed. The risks                            and benefits of the procedure and the sedation                            options and risks were discussed with the patient.                            All questions were answered, and informed consent                            was obtained. Prior Anticoagulants: The patient has                            taken no anticoagulant or antiplatelet agents. ASA                            Grade Assessment: II - A patient with mild systemic                            disease. After reviewing the risks and benefits,                            the patient was deemed in satisfactory condition to                            undergo the procedure.  After obtaining informed consent, the colonoscope                            was passed under direct vision. Throughout the                            procedure, the patient's blood pressure, pulse, and                            oxygen saturations were monitored continuously. The                            CF [id number] #[id number] was introduced through the anus                            and advanced to  the the terminal ileum, with                            identification of the appendiceal orifice and IC                            valve. The colonoscopy was performed without                            difficulty. The patient tolerated the procedure                            well. The quality of the bowel preparation was                            adequate. The terminal ileum, ileocecal valve,                            appendiceal orifice, and rectum were photographed.                            The bowel preparation used was SUPREP via split                            dose instruction. Scope In: 2:09:24 PM Scope Out: 2:34:05 PM Scope Withdrawal Time: 0 hours 19 minutes 29 seconds  Total Procedure Duration: 0 hours 24 minutes 41 seconds  Findings:                 The perianal and digital rectal examinations were                            normal. Pertinent negatives include normal                            sphincter tone and no palpable rectal lesions.                           Two sessile polyps were found in the cecum. The  polyps were 2 mm in size. These polyps were removed                            with a cold snare. Resection and retrieval were                            complete. Estimated blood loss was minimal.                           Two sessile polyps were found in the transverse                            colon. The polyps were 3 to 4 mm in size. These                            polyps were removed with a cold snare. Resection                            and retrieval were complete. Estimated blood loss                            was minimal.                           A 2 mm polyp was found in the sigmoid colon. The                            polyp was sessile. The polyp was removed with a                            cold snare. Resection and retrieval were complete.                            Estimated blood loss was minimal.                            Multiple medium-mouthed and small-mouthed                            diverticula were found in the sigmoid colon,                            descending colon and ascending colon. There was no                            evidence of diverticular bleeding.                           The exam was otherwise normal throughout the                            examined colon.  The terminal ileum appeared normal.                           Anal papilla(e) were hypertrophied.                           No additional abnormalities were found on                            retroflexion. Complications:            No immediate complications. Estimated Blood Loss:     Estimated blood loss was minimal. Impression:               - Two 2 mm polyps in the cecum, removed with a cold                            snare. Resected and retrieved.                           - Two 3 to 4 mm polyps in the transverse colon,                            removed with a cold snare. Resected and retrieved.                           - One 2 mm polyp in the sigmoid colon, removed with                            a cold snare. Resected and retrieved.                           - Mild diverticulosis in the sigmoid colon, in the                            descending colon and in the ascending colon. There                            was no evidence of diverticular bleeding.                           - The examined portion of the ileum was normal.                           - Anal papilla(e) were hypertrophied. Recommendation:           - Patient has a contact number available for                            emergencies. The signs and symptoms of potential                            delayed complications were discussed with the  patient. Return to normal activities tomorrow.                            Written discharge instructions were provided to the                             patient.                           - Resume previous diet.                           - Continue present medications.                           - Await pathology results.                           - Repeat colonoscopy (date not yet determined) for                            surveillance based on pathology results. Keng Jewel E. Stacia, MD 05/07/2023 2:40:02 PM This report has been signed electronically.

## 2023-05-08 ENCOUNTER — Telehealth: Payer: Self-pay | Admitting: *Deleted

## 2023-05-08 NOTE — Telephone Encounter (Signed)
 Post procedure follow up call placed, wrong number was provided for follow up call.

## 2023-05-10 ENCOUNTER — Encounter: Payer: Self-pay | Admitting: Physician Assistant

## 2023-05-10 ENCOUNTER — Ambulatory Visit: Payer: Medicare Other | Attending: Physician Assistant | Admitting: Physician Assistant

## 2023-05-10 VITALS — BP 168/90 | HR 75 | Ht 70.0 in | Wt 224.2 lb

## 2023-05-10 DIAGNOSIS — F419 Anxiety disorder, unspecified: Secondary | ICD-10-CM | POA: Insufficient documentation

## 2023-05-10 DIAGNOSIS — R600 Localized edema: Secondary | ICD-10-CM | POA: Insufficient documentation

## 2023-05-10 DIAGNOSIS — R931 Abnormal findings on diagnostic imaging of heart and coronary circulation: Secondary | ICD-10-CM | POA: Insufficient documentation

## 2023-05-10 DIAGNOSIS — I10 Essential (primary) hypertension: Secondary | ICD-10-CM | POA: Insufficient documentation

## 2023-05-10 LAB — SURGICAL PATHOLOGY

## 2023-05-10 MED ORDER — CHLORTHALIDONE 25 MG PO TABS: 25.0000 mg | ORAL_TABLET | Freq: Every day | ORAL | 3 refills | Status: AC

## 2023-05-10 NOTE — Patient Instructions (Signed)
Medication Instructions:  STOP HYDROCHLOROTHIAZIDE  START CHLORTHALIDONE 25 MG DAILY.  *If you need a refill on your cardiac medications before your next appointment, please call your pharmacy*   Lab Work: NO LABS If you have labs (blood work) drawn today and your tests are completely normal, you will receive your results only by: MyChart Message (if you have MyChart) OR A paper copy in the mail If you have any lab test that is abnormal or we need to change your treatment, we will call you to review the results.   Testing/Procedures: NO TESTING   Follow-Up: At [facility], you and your health needs are our priority.  As part of our continuing mission to provide you with exceptional heart care, we have created designated Provider Care Teams.  These Care Teams include your primary Cardiologist (physician) and Advanced Practice Providers (APPs -  Physician Assistants and Nurse Practitioners) who all work together to provide you with the care you need, when you need it.   Your next appointment:   6 month(s)  Provider:   Nanetta Batty, MD   Other Instructions You have been referred to Dr. [facility]
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
## 2023-05-15 ENCOUNTER — Encounter: Payer: Self-pay | Admitting: Gastroenterology

## 2023-05-15 NOTE — Progress Notes (Signed)
 David Villanueva,  Three of the five polyps that I removed during your recent procedure were completely benign but were proven to be pre-cancerous polyps that MAY have grown into cancers if they had not been removed.  Studies shows that at least 20% of women over age 71 and 30% of men over age 83 have pre-cancerous polyps.  Based on current nationally recognized surveillance guidelines, I recommend that you have a repeat colonoscopy in 5 years.   If you develop any new rectal bleeding, abdominal pain or significant bowel habit changes, please contact me before then.

## 2023-05-22 ENCOUNTER — Other Ambulatory Visit (HOSPITAL_COMMUNITY): Payer: Self-pay

## 2023-05-22 ENCOUNTER — Telehealth: Payer: Self-pay

## 2023-05-22 NOTE — Telephone Encounter (Signed)
Pharmacy Patient Advocate Encounter  Received notification from [facility] that Prior Authorization for REPATHA has been APPROVED from 04/24/23 to 04/22/24. Ran test claim, Copay is $538.64 (DEDUCTIBLE, PT WILL LIKLEY BENEFIT FROM SETTING UP A MEDICARE PAYMENT PLAN). This test claim was processed through [facility]- copay amounts may vary at other pharmacies due to pharmacy/plan contracts, or as the patient moves through the different stages of their insurance plan.

## 2023-05-22 NOTE — Telephone Encounter (Signed)
Pharmacy Patient Advocate Encounter   Received notification from CoverMyMeds that prior authorization for REPATHA is required/requested.   Insurance verification completed.   The patient is insured through [employer] .   Per test claim: PA required; PA submitted to above mentioned insurance via CoverMyMeds Key/confirmation #/[id number] [facility] Status is pending

## 2023-06-11 ENCOUNTER — Encounter (HOSPITAL_BASED_OUTPATIENT_CLINIC_OR_DEPARTMENT_OTHER): Payer: Self-pay

## 2023-07-06 ENCOUNTER — Other Ambulatory Visit: Payer: Self-pay | Admitting: Adult Health

## 2023-08-08 ENCOUNTER — Institutional Professional Consult (permissible substitution) (HOSPITAL_BASED_OUTPATIENT_CLINIC_OR_DEPARTMENT_OTHER): Payer: Medicare Other | Admitting: Family

## 2023-09-12 ENCOUNTER — Encounter: Payer: Self-pay | Admitting: Cardiovascular Disease

## 2023-10-03 ENCOUNTER — Ambulatory Visit (HOSPITAL_BASED_OUTPATIENT_CLINIC_OR_DEPARTMENT_OTHER): Admitting: Family

## 2023-10-03 ENCOUNTER — Other Ambulatory Visit: Payer: Self-pay | Admitting: Unknown Physician Specialty

## 2023-10-03 VITALS — BP 131/81 | HR 66 | Ht 70.0 in | Wt 218.8 lb

## 2023-10-03 DIAGNOSIS — I10 Essential (primary) hypertension: Secondary | ICD-10-CM | POA: Diagnosis not present

## 2023-10-03 DIAGNOSIS — H903 Sensorineural hearing loss, bilateral: Secondary | ICD-10-CM

## 2023-10-03 DIAGNOSIS — I2510 Atherosclerotic heart disease of native coronary artery without angina pectoris: Secondary | ICD-10-CM

## 2023-10-03 DIAGNOSIS — E785 Hyperlipidemia, unspecified: Secondary | ICD-10-CM

## 2023-10-03 NOTE — Patient Instructions (Signed)
 Medication Instructions:   Continue Amlodipine  10mg  daily  Continue Chlorthalidone  25mg  daily   Labwork: Your physician recommends that you return for lab work one day next week for fasting lipid panel, CMET, renin-aldosterone    Follow-Up: Please follow up as needed with Advanced Hypertension Clinic    Special Instructions:
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
## 2023-10-04 ENCOUNTER — Encounter (HOSPITAL_BASED_OUTPATIENT_CLINIC_OR_DEPARTMENT_OTHER): Payer: Self-pay | Admitting: Family

## 2023-10-08 ENCOUNTER — Ambulatory Visit
Admission: RE | Admit: 2023-10-08 | Discharge: 2023-10-08 | Discharge status: Home / Self Care | Source: Ambulatory Visit | Attending: Unknown Physician Specialty | Admitting: Unknown Physician Specialty

## 2023-10-08 DIAGNOSIS — H903 Sensorineural hearing loss, bilateral: Secondary | ICD-10-CM

## 2023-10-08 MED ORDER — GADOPICLENOL 0.5 MMOL/ML IV SOLN
10.0000 mL | Freq: Once | INTRAVENOUS | Status: AC | PRN
  Administered 2023-10-08: 10 mL via INTRAVENOUS

## 2023-10-10 LAB — LIPID PANEL
Chol/HDL Ratio: 5.2 ratio — ABNORMAL HIGH (ref 0.0–5.0)
Cholesterol, Total: 229 mg/dL — ABNORMAL HIGH (ref 100–199)
HDL: 44 mg/dL (ref >39)
LDL Chol Calc (NIH): 137 mg/dL — ABNORMAL HIGH (ref 0–99)
Triglycerides: 264 mg/dL — ABNORMAL HIGH (ref 0–149)
VLDL Cholesterol Cal: 48 mg/dL — ABNORMAL HIGH (ref 5–40)

## 2023-10-10 LAB — ALDOSTERONE + RENIN ACTIVITY W/ RATIO
Aldos/Renin Ratio: 10.6 (ref 0.0–30.0)
Aldosterone: 17.2 ng/dL (ref 0.0–30.0)
Renin Activity, Plasma: 1.627 ng/mL/h (ref 0.167–5.380)

## 2023-10-10 LAB — COMPREHENSIVE METABOLIC PANEL WITH GFR
ALT: 18 IU/L (ref 0–44)
AST: 17 IU/L (ref 0–40)
Albumin: 4.7 g/dL (ref 3.9–4.9)
Alkaline Phosphatase: 64 IU/L (ref 44–121)
BUN/Creatinine Ratio: 8 — ABNORMAL LOW (ref 10–24)
BUN: 10 mg/dL (ref 8–27)
Bilirubin Total: 0.7 mg/dL (ref 0.0–1.2)
CO2: 21 mmol/L (ref 20–29)
Calcium: 10.1 mg/dL (ref 8.6–10.2)
Chloride: 100 mmol/L (ref 96–106)
Creatinine, Ser: 1.25 mg/dL (ref 0.76–1.27)
Globulin, Total: 2.6 g/dL (ref 1.5–4.5)
Glucose: 103 mg/dL — ABNORMAL HIGH (ref 70–99)
Potassium: 4.3 mmol/L (ref 3.5–5.2)
Sodium: 141 mmol/L (ref 134–144)
Total Protein: 7.3 g/dL (ref 6.0–8.5)
eGFR: 62 mL/min/{1.73_m2} (ref >59)

## 2023-10-11 ENCOUNTER — Ambulatory Visit (HOSPITAL_BASED_OUTPATIENT_CLINIC_OR_DEPARTMENT_OTHER): Payer: Self-pay | Admitting: Family

## 2023-10-14 NOTE — Telephone Encounter (Signed)
 Please review and advise.

## 2023-11-21 ENCOUNTER — Other Ambulatory Visit: Payer: Self-pay | Admitting: Neurosurgery

## 2023-11-21 DIAGNOSIS — M4726 Other spondylosis with radiculopathy, lumbar region: Secondary | ICD-10-CM

## 2023-11-21 DIAGNOSIS — G912 (Idiopathic) normal pressure hydrocephalus: Secondary | ICD-10-CM

## 2023-11-21 DIAGNOSIS — M4722 Other spondylosis with radiculopathy, cervical region: Secondary | ICD-10-CM

## 2023-11-23 ENCOUNTER — Ambulatory Visit
Admission: RE | Admit: 2023-11-23 | Discharge: 2023-11-23 | Disposition: A | Discharge status: Home / Self Care | Source: Ambulatory Visit | Attending: Neurosurgery | Admitting: Neurosurgery

## 2023-11-23 DIAGNOSIS — M4726 Other spondylosis with radiculopathy, lumbar region: Secondary | ICD-10-CM

## 2023-11-23 DIAGNOSIS — M4722 Other spondylosis with radiculopathy, cervical region: Secondary | ICD-10-CM

## 2023-11-25 ENCOUNTER — Ambulatory Visit
Admission: RE | Admit: 2023-11-25 | Discharge: 2023-11-25 | Disposition: A | Discharge status: Home / Self Care | Source: Ambulatory Visit | Attending: Neurosurgery | Admitting: Neurosurgery

## 2023-11-25 DIAGNOSIS — G912 (Idiopathic) normal pressure hydrocephalus: Secondary | ICD-10-CM

## 2023-12-03 NOTE — Telephone Encounter (Signed)
 Spoke to patient. Verified name and DOB. Patient stated he would still like to be seen by Dr. Mona regarding a lipid follow up. An appointment was scheduled on 01/30/2024 at 2:40 pm. Patient agreeable to appointment time and date.   Josie RN

## 2023-12-04 ENCOUNTER — Other Ambulatory Visit: Payer: Self-pay | Admitting: Otolaryngology

## 2023-12-04 DIAGNOSIS — H908 Mixed conductive and sensorineural hearing loss, unspecified: Secondary | ICD-10-CM

## 2023-12-12 ENCOUNTER — Other Ambulatory Visit

## 2023-12-25 ENCOUNTER — Other Ambulatory Visit: Payer: Self-pay | Admitting: Neurosurgery

## 2024-01-15 NOTE — Progress Notes (Signed)
 Surgical Instructions   Your procedure is scheduled on Friday January 17, 2024. Report to [facility] Main Entrance A at 9:30 A.M., then check in with the Admitting office. Any questions or running late day of surgery: call [phone]  Questions prior to your surgery date: call [phone], Monday-Friday, 8am-4pm. If you experience any cold or flu symptoms such as cough, fever, chills, shortness of breath, etc. between now and your scheduled surgery, please notify us  at the above number.     Remember:  Do not eat or drink after midnight the night before your surgery  Take these medicines the morning of surgery with A SIP OF WATER  amLODipine  (NORVASC )   One week prior to surgery, STOP taking any Aspirin (unless otherwise instructed by your surgeon) Aleve, Naproxen, Ibuprofen, Motrin, Advil, Goody's, BC's, all herbal medications, fish oil, and non-prescription vitamins.                     Do NOT Smoke (Tobacco/Vaping) for 24 hours prior to your procedure.  If you use a CPAP at night, you may bring your mask/headgear for your overnight stay.   You will be asked to remove any contacts, glasses, piercing's, hearing aid's, dentures/partials prior to surgery. Please bring cases for these items if needed.    Patients discharged the day of surgery will not be allowed to drive home, and someone needs to stay with them for 24 hours.  SURGICAL WAITING ROOM VISITATION Patients may have no more than 2 support people in the waiting area - these visitors may rotate.   Pre-op nurse will coordinate an appropriate time for 1 ADULT support person, who may not rotate, to accompany patient in pre-op.  Children under the age of 72 must have an adult with them who is not the patient and must remain in the main waiting area with an adult.  If the patient needs to stay at the hospital during part of their recovery, the visitor guidelines for inpatient rooms apply.  Please refer to the [facility]  website for the visitor guidelines for any additional information.   If you received a COVID test during your pre-op visit  it is requested that you wear a mask when out in public, stay away from anyone that may not be feeling well and notify your surgeon if you develop symptoms. If you have been in contact with anyone that has tested positive in the last 10 days please notify you surgeon.      Pre-operative 5 CHG Bathing Instructions   You can play a key role in reducing the risk of infection after surgery. Your skin needs to be as free of germs as possible. You can reduce the number of germs on your skin by washing with CHG (chlorhexidine  gluconate) soap before surgery. CHG is an antiseptic soap that kills germs and continues to kill germs even after washing.   DO NOT use if you have an allergy to chlorhexidine /CHG or antibacterial soaps. If your skin becomes reddened or irritated, stop using the CHG and notify one of our RNs at [phone].   Please shower with the CHG soap starting 4 days before surgery using the following schedule:     Please keep in mind the following:   You may shave your face at any point before/day of surgery.  Place clean sheets on your bed the day you start using CHG soap. Use a clean washcloth (not used since being washed) for each shower. DO NOT sleep  with pets once you start using the CHG.   CHG Shower Instructions:  Wash your face and private area with normal soap. If you choose to wash your hair, wash first with your normal shampoo.  After you use shampoo/soap, rinse your hair and body thoroughly to remove shampoo/soap residue.  Turn the water OFF and apply about 3 tablespoons (45 ml) of CHG soap to a CLEAN washcloth.  Apply CHG soap ONLY FROM YOUR NECK DOWN TO YOUR TOES (washing for 3-5 minutes)  DO NOT use CHG soap on face, private areas, open wounds, or sores.  Pay special attention to the area where your surgery is being performed.  If you are  having back surgery, having someone wash your back for you may be helpful. Wait 2 minutes after CHG soap is applied, then you may rinse off the CHG soap.  Pat dry with a clean towel  Put on clean clothes/pajamas   If you choose to wear lotion, please use ONLY the CHG-compatible lotions that are listed below.  Additional instructions for the day of surgery: DO NOT APPLY any lotions, deodorants  or cologne   Do not bring valuables to the hospital. [facility] is not responsible for any belongings/valuables. Do not wear jewelry  Put on clean/comfortable clothes.  Please brush your teeth.  Ask your nurse before applying any prescription medications to the skin.     CHG Compatible Lotions   Aveeno Moisturizing lotion  Cetaphil Moisturizing Cream  Cetaphil Moisturizing Lotion  Clairol Herbal Essence Moisturizing Lotion, Dry Skin  Clairol Herbal Essence Moisturizing Lotion, Extra Dry Skin  Clairol Herbal Essence Moisturizing Lotion, Normal Skin  Curel Age Defying Therapeutic Moisturizing Lotion with Alpha Hydroxy  Curel Extreme Care Body Lotion  Curel Soothing Hands Moisturizing Hand Lotion  Curel Therapeutic Moisturizing Cream, Fragrance-Free  Curel Therapeutic Moisturizing Lotion, Fragrance-Free  Curel Therapeutic Moisturizing Lotion, Original Formula  Eucerin Daily Replenishing Lotion  Eucerin Dry Skin Therapy Plus Alpha Hydroxy Crme  Eucerin Dry Skin Therapy Plus Alpha Hydroxy Lotion  Eucerin Original Crme  Eucerin Original Lotion  Eucerin Plus Crme Eucerin Plus Lotion  Eucerin TriLipid Replenishing Lotion  Keri Anti-Bacterial Hand Lotion  Keri Deep Conditioning Original Lotion Dry Skin Formula Softly Scented  Keri Deep Conditioning Original Lotion, Fragrance Free Sensitive Skin Formula  Keri Lotion Fast Absorbing Fragrance Free Sensitive Skin Formula  Keri Lotion Fast Absorbing Softly Scented Dry Skin Formula  Keri Original Lotion  Keri Skin Renewal Lotion Keri Silky  Smooth Lotion  Keri Silky Smooth Sensitive Skin Lotion  Nivea Body Creamy Conditioning Oil  Nivea Body Extra Enriched Lotion  Nivea Body Original Lotion  Nivea Body Sheer Moisturizing Lotion Nivea Crme  Nivea Skin Firming Lotion  NutraDerm 30 Skin Lotion  NutraDerm Skin Lotion  NutraDerm Therapeutic Skin Cream  NutraDerm Therapeutic Skin Lotion  ProShield Protective Hand Cream  Provon moisturizing lotion  Please read over the following fact sheets that you were given.

## 2024-01-16 ENCOUNTER — Encounter (HOSPITAL_COMMUNITY)
Admission: RE | Admit: 2024-01-16 | Discharge: 2024-01-16 | Disposition: A | Discharge status: Home / Self Care | Source: Ambulatory Visit | Attending: Neurosurgery | Admitting: Neurosurgery

## 2024-01-16 ENCOUNTER — Other Ambulatory Visit: Payer: Self-pay

## 2024-01-16 ENCOUNTER — Other Ambulatory Visit

## 2024-01-16 ENCOUNTER — Encounter (HOSPITAL_COMMUNITY): Payer: Self-pay

## 2024-01-16 ENCOUNTER — Ambulatory Visit
Admission: RE | Admit: 2024-01-16 | Discharge: 2024-01-16 | Disposition: A | Discharge status: Home / Self Care | Source: Ambulatory Visit | Attending: Otolaryngology | Admitting: Otolaryngology

## 2024-01-16 VITALS — BP 131/86 | HR 58 | Temp 97.8°F | Resp 17 | Ht 70.0 in | Wt 220.7 lb

## 2024-01-16 DIAGNOSIS — I340 Nonrheumatic mitral (valve) insufficiency: Secondary | ICD-10-CM | POA: Insufficient documentation

## 2024-01-16 DIAGNOSIS — Z01818 Encounter for other preprocedural examination: Secondary | ICD-10-CM

## 2024-01-16 DIAGNOSIS — I10 Essential (primary) hypertension: Secondary | ICD-10-CM | POA: Insufficient documentation

## 2024-01-16 DIAGNOSIS — H908 Mixed conductive and sensorineural hearing loss, unspecified: Secondary | ICD-10-CM

## 2024-01-16 DIAGNOSIS — Z01812 Encounter for preprocedural laboratory examination: Secondary | ICD-10-CM | POA: Insufficient documentation

## 2024-01-16 DIAGNOSIS — I2510 Atherosclerotic heart disease of native coronary artery without angina pectoris: Secondary | ICD-10-CM | POA: Diagnosis not present

## 2024-01-16 HISTORY — DX: Pneumonia, unspecified organism: J18.9

## 2024-01-16 HISTORY — DX: Atherosclerotic heart disease of native coronary artery without angina pectoris: I25.10

## 2024-01-16 LAB — SURGICAL PCR SCREEN
MRSA, PCR: NEGATIVE
Staphylococcus aureus: NEGATIVE

## 2024-01-16 LAB — COMPREHENSIVE METABOLIC PANEL WITH GFR
ALT: 19 U/L (ref 0–44)
AST: 25 U/L (ref 15–41)
Albumin: 3.8 g/dL (ref 3.5–5.0)
Alkaline Phosphatase: 45 U/L (ref 38–126)
Anion gap: 10 (ref 5–15)
BUN: 8 mg/dL (ref 8–23)
CO2: 27 mmol/L (ref 22–32)
Calcium: 9.6 mg/dL (ref 8.9–10.3)
Chloride: 102 mmol/L (ref 98–111)
Creatinine, Ser: 1.06 mg/dL (ref 0.61–1.24)
GFR, Estimated: >60 mL/min (ref >60)
Glucose, Bld: 86 mg/dL (ref 70–99)
Potassium: 3.7 mmol/L (ref 3.5–5.1)
Sodium: 139 mmol/L (ref 135–145)
Total Bilirubin: 1 mg/dL (ref 0.0–1.2)
Total Protein: 7 g/dL (ref 6.5–8.1)

## 2024-01-16 LAB — CBC
HCT: 47.8 % (ref 39.0–52.0)
Hemoglobin: 16.4 g/dL (ref 13.0–17.0)
MCH: 31.4 pg (ref 26.0–34.0)
MCHC: 34.3 g/dL (ref 30.0–36.0)
MCV: 91.6 fL (ref 80.0–100.0)
Platelets: 240 K/uL (ref 150–400)
RBC: 5.22 MIL/uL (ref 4.22–5.81)
RDW: 13.9 % (ref 11.5–15.5)
WBC: 6 K/uL (ref 4.0–10.5)
nRBC: 0 % (ref 0.0–0.2)

## 2024-01-16 NOTE — Progress Notes (Signed)
 PCP - Dr. Darina Riner Cardiologist - Dr. Dorn Lesches - LOV - 10/03/23  PPM/ICD - denies   Chest x-ray - denies EKG - 04/08/23 Stress Test - denies ECHO - 05/02/23 Cardiac Cath - denies  Sleep Study - denies  No DM  Last dose of GLP1 agonist-  n/a GLP1 instructions:  n/a  Blood Thinner Instructions: n/a Aspirin Instructions: patient states that he stopped Aspirin over a month ago  ERAS Protcol - NPO   COVID TEST- n/a   Anesthesia review: yes - sent for CAD and HTN  Patient denies shortness of breath, fever, cough and chest pain at PAT appointment   All instructions explained to the patient, with a verbal understanding of the material. Patient agrees to go over the instructions while at home for a better understanding. Patient also instructed to self quarantine after being tested for COVID-19. The opportunity to ask questions was provided.

## 2024-01-16 NOTE — Progress Notes (Signed)
 Anesthesia Chart Review: Same-day workup  73 year old male follows with cardiology for history of HTN, nonobstructive CAD, LE edema, HLD.  Coronary calcium  score 06/2021 was 259, 64th percentile.  Echo 04/2023 showed LVEF 60 to 65%, mild LVH, normal RV, mild mitral regurgitation, mild dilatation of the aortic root 38 mm.  Last seen in follow-up in the advanced hypertension clinic by Reche Finder, NP on 10/03/2023.  BP reasonably well-controlled 132/80.  Continued on amlodipine  10 mg daily, chlorthalidone  25 mg daily.  Otherwise stable, no anginal symptoms, no indication for ischemic evaluation.  Patient will need day of surgery labs and evaluation.  EKG 03/29/2023: Sinus bradycardia with first-degree AV block.  Rate 55.  TTE 05/02/2023:  1. Left ventricular ejection fraction, by estimation, is 60 to 65%. Left  ventricular ejection fraction by 3D volume is 60 %. The left ventricle has  normal function. The left ventricle has no regional wall motion  abnormalities. There is mild concentric  left ventricular hypertrophy. Left ventricular diastolic parameters are  indeterminate. The average left ventricular global longitudinal strain is  -19.7 %. The global longitudinal strain is normal.   2. Right ventricular systolic function is normal. The right ventricular  size is normal.   3. The mitral valve is normal in structure. Mild mitral valve  regurgitation. No evidence of mitral stenosis.   4. The aortic valve is normal in structure. Aortic valve regurgitation is  not visualized. No aortic stenosis is present.   5. There is mild dilatation of the aortic root, measuring 38 mm. There is  mild dilatation of the ascending aorta, measuring 38 mm.   6. The inferior vena cava is normal in size with greater than 50%  respiratory variability, suggesting right atrial pressure of 3 mmHg.      Lynwood Geofm [facility] Short Stay Center/Anesthesiology Phone [phone] 01/16/2024 10:17 AM

## 2024-01-16 NOTE — Anesthesia Preprocedure Evaluation (Signed)
 Anesthesia Evaluation  Patient identified by MRN, date of birth, ID band Patient awake    Reviewed: Allergy & Precautions, NPO status , Patient's Chart, lab work & pertinent test results  Airway Mallampati: II  TM Distance: >3 FB Neck ROM: Full    Dental   Pulmonary neg pulmonary ROS   Pulmonary exam normal        Cardiovascular hypertension, Pt. on medications + CAD   Rhythm:Regular Rate:Normal     Neuro/Psych negative neurological ROS     GI/Hepatic negative GI ROS, Neg liver ROS,,,  Endo/Other  negative endocrine ROS    Renal/GU negative Renal ROS     Musculoskeletal  (+) Arthritis ,    Abdominal   Peds  Hematology negative hematology ROS (+)   Anesthesia Other Findings   Reproductive/Obstetrics                              Anesthesia Physical Anesthesia Plan  ASA: 3  Anesthesia Plan: General   Post-op Pain Management: Tylenol  PO (pre-op)*   Induction: Intravenous  PONV Risk Score and Plan: 2 and Dexamethasone , Ondansetron  and Treatment may vary due to age or medical condition  Airway Management Planned: Oral ETT  Additional Equipment: None  Intra-op Plan:   Post-operative Plan: Extubation in OR  Informed Consent: I have reviewed the patients History and Physical, chart, labs and discussed the procedure including the risks, benefits and alternatives for the proposed anesthesia with the patient or authorized representative who has indicated his/her understanding and acceptance.     Dental advisory given  Plan Discussed with:   Anesthesia Plan Comments: (PAT note by Lynwood Hope, PA-C: 73 year old male follows with cardiology for history of HTN, nonobstructive CAD, LE edema, HLD.  Coronary calcium  score 06/2021 was 259, 64th percentile.  Echo 04/2023 showed LVEF 60 to 65%, mild LVH, normal RV, mild mitral regurgitation, mild dilatation of the aortic root 38 mm.  Last seen  in follow-up in the advanced hypertension clinic by Reche Finder, NP on 10/03/2023.  BP reasonably well-controlled 132/80.  Continued on amlodipine  10 mg daily, chlorthalidone  25 mg daily.  Otherwise stable, no anginal symptoms, no indication for ischemic evaluation.  Patient will need day of surgery labs and evaluation.  EKG 03/29/2023: Sinus bradycardia with first-degree AV block.  Rate 55.  TTE 05/02/2023: 1. Left ventricular ejection fraction, by estimation, is 60 to 65%. Left  ventricular ejection fraction by 3D volume is 60 %. The left ventricle has  normal function. The left ventricle has no regional wall motion  abnormalities. There is mild concentric  left ventricular hypertrophy. Left ventricular diastolic parameters are  indeterminate. The average left ventricular global longitudinal strain is  -19.7 %. The global longitudinal strain is normal.  2. Right ventricular systolic function is normal. The right ventricular  size is normal.  3. The mitral valve is normal in structure. Mild mitral valve  regurgitation. No evidence of mitral stenosis.  4. The aortic valve is normal in structure. Aortic valve regurgitation is  not visualized. No aortic stenosis is present.  5. There is mild dilatation of the aortic root, measuring 38 mm. There is  mild dilatation of the ascending aorta, measuring 38 mm.  6. The inferior vena cava is normal in size with greater than 50%  respiratory variability, suggesting right atrial pressure of 3 mmHg.    )         Anesthesia Quick Evaluation

## 2024-01-17 ENCOUNTER — Encounter (HOSPITAL_COMMUNITY)
Admission: RE | Disposition: A | Discharge status: Home / Self Care | Payer: Self-pay | Source: Home / Self Care | Attending: Neurosurgery

## 2024-01-17 ENCOUNTER — Ambulatory Visit (HOSPITAL_COMMUNITY): Payer: Self-pay | Admitting: Anesthesiology

## 2024-01-17 ENCOUNTER — Ambulatory Visit (HOSPITAL_COMMUNITY)
Admission: RE | Admit: 2024-01-17 | Discharge: 2024-01-17 | Disposition: A | Discharge status: Home / Self Care | Attending: Neurosurgery | Admitting: Neurosurgery

## 2024-01-17 ENCOUNTER — Other Ambulatory Visit (HOSPITAL_BASED_OUTPATIENT_CLINIC_OR_DEPARTMENT_OTHER): Payer: Self-pay | Admitting: Family

## 2024-01-17 ENCOUNTER — Other Ambulatory Visit: Payer: Self-pay

## 2024-01-17 ENCOUNTER — Encounter (HOSPITAL_COMMUNITY): Payer: Self-pay | Admitting: Physician Assistant

## 2024-01-17 ENCOUNTER — Ambulatory Visit (HOSPITAL_COMMUNITY)

## 2024-01-17 ENCOUNTER — Other Ambulatory Visit (HOSPITAL_COMMUNITY): Payer: Self-pay

## 2024-01-17 ENCOUNTER — Encounter (HOSPITAL_COMMUNITY): Payer: Self-pay | Admitting: Neurosurgery

## 2024-01-17 DIAGNOSIS — E785 Hyperlipidemia, unspecified: Secondary | ICD-10-CM | POA: Diagnosis not present

## 2024-01-17 DIAGNOSIS — M1990 Unspecified osteoarthritis, unspecified site: Secondary | ICD-10-CM | POA: Insufficient documentation

## 2024-01-17 DIAGNOSIS — Z7982 Long term (current) use of aspirin: Secondary | ICD-10-CM | POA: Insufficient documentation

## 2024-01-17 DIAGNOSIS — I2510 Atherosclerotic heart disease of native coronary artery without angina pectoris: Secondary | ICD-10-CM | POA: Diagnosis not present

## 2024-01-17 DIAGNOSIS — M5116 Intervertebral disc disorders with radiculopathy, lumbar region: Secondary | ICD-10-CM | POA: Insufficient documentation

## 2024-01-17 DIAGNOSIS — Z79899 Other long term (current) drug therapy: Secondary | ICD-10-CM | POA: Diagnosis not present

## 2024-01-17 DIAGNOSIS — I10 Essential (primary) hypertension: Secondary | ICD-10-CM | POA: Insufficient documentation

## 2024-01-17 DIAGNOSIS — M5126 Other intervertebral disc displacement, lumbar region: Secondary | ICD-10-CM

## 2024-01-17 HISTORY — PX: LUMBAR LAMINECTOMY/DECOMPRESSION MICRODISCECTOMY: SHX5026

## 2024-01-17 SURGERY — LUMBAR LAMINECTOMY/DECOMPRESSION MICRODISCECTOMY 1 LEVEL
Anesthesia: General | Laterality: Right

## 2024-01-17 MED ORDER — MIDAZOLAM HCL 2 MG/2ML IJ SOLN
INTRAMUSCULAR | Status: AC
  Filled 2024-01-17: qty 2

## 2024-01-17 MED ORDER — CEFAZOLIN SODIUM-DEXTROSE 2-4 GM/100ML-% IV SOLN
2.0000 g | INTRAVENOUS | Status: AC
  Administered 2024-01-17: 2 g via INTRAVENOUS
  Filled 2024-01-17: qty 100

## 2024-01-17 MED ORDER — LIDOCAINE 2% (20 MG/ML) 5 ML SYRINGE
INTRAMUSCULAR | Status: AC
  Filled 2024-01-17: qty 5

## 2024-01-17 MED ORDER — CHLORHEXIDINE GLUCONATE CLOTH 2 % EX PADS: 6.0000 | MEDICATED_PAD | Freq: Once | CUTANEOUS | Status: DC

## 2024-01-17 MED ORDER — FENTANYL CITRATE (PF) 250 MCG/5ML IJ SOLN
INTRAMUSCULAR | Status: AC
  Filled 2024-01-17: qty 5

## 2024-01-17 MED ORDER — PHENYLEPHRINE 80 MCG/ML (10ML) SYRINGE FOR IV PUSH (FOR BLOOD PRESSURE SUPPORT)
PREFILLED_SYRINGE | INTRAVENOUS | Status: AC
  Filled 2024-01-17: qty 10

## 2024-01-17 MED ORDER — ONDANSETRON HCL 4 MG/2ML IJ SOLN
INTRAMUSCULAR | Status: DC | PRN
  Administered 2024-01-17: 4 mg via INTRAVENOUS

## 2024-01-17 MED ORDER — LACTATED RINGERS IV SOLN
INTRAVENOUS | Status: DC
Start: 2024-01-17 — End: 2024-01-17

## 2024-01-17 MED ORDER — OXYCODONE HCL 5 MG PO TABS: 5.0000 mg | ORAL_TABLET | Freq: Once | ORAL | Status: DC | PRN

## 2024-01-17 MED ORDER — ROCURONIUM BROMIDE 10 MG/ML (PF) SYRINGE
PREFILLED_SYRINGE | INTRAVENOUS | Status: DC | PRN
Start: 2024-01-17 — End: 2024-01-17
  Administered 2024-01-17: 60 mg via INTRAVENOUS
  Administered 2024-01-17: 10 mg via INTRAVENOUS

## 2024-01-17 MED ORDER — LIDOCAINE 2% (20 MG/ML) 5 ML SYRINGE
INTRAMUSCULAR | Status: DC | PRN
  Administered 2024-01-17: 60 mg via INTRAVENOUS

## 2024-01-17 MED ORDER — DEXAMETHASONE SODIUM PHOSPHATE 10 MG/ML IJ SOLN
INTRAMUSCULAR | Status: AC
  Filled 2024-01-17: qty 1

## 2024-01-17 MED ORDER — EPHEDRINE 5 MG/ML INJ
INTRAVENOUS | Status: AC
  Filled 2024-01-17: qty 5

## 2024-01-17 MED ORDER — ONDANSETRON HCL 4 MG/2ML IJ SOLN
INTRAMUSCULAR | Status: AC
  Filled 2024-01-17: qty 2

## 2024-01-17 MED ORDER — PHENYLEPHRINE 80 MCG/ML (10ML) SYRINGE FOR IV PUSH (FOR BLOOD PRESSURE SUPPORT)
PREFILLED_SYRINGE | INTRAVENOUS | Status: DC | PRN
  Administered 2024-01-17: 80 ug via INTRAVENOUS
  Administered 2024-01-17: 120 ug via INTRAVENOUS
  Administered 2024-01-17: 80 ug via INTRAVENOUS
  Administered 2024-01-17: 160 ug via INTRAVENOUS
  Administered 2024-01-17: 120 ug via INTRAVENOUS

## 2024-01-17 MED ORDER — AMISULPRIDE (ANTIEMETIC) 5 MG/2ML IV SOLN: 10.0000 mg | Freq: Once | INTRAVENOUS | Status: DC | PRN

## 2024-01-17 MED ORDER — EPHEDRINE SULFATE-NACL 50-0.9 MG/10ML-% IV SOSY
PREFILLED_SYRINGE | INTRAVENOUS | Status: DC | PRN
  Administered 2024-01-17: 5 mg via INTRAVENOUS

## 2024-01-17 MED ORDER — CHLORHEXIDINE GLUCONATE 0.12 % MT SOLN
15.0000 mL | Freq: Once | OROMUCOSAL | Status: AC
  Administered 2024-01-17: 15 mL via OROMUCOSAL
  Filled 2024-01-17: qty 15

## 2024-01-17 MED ORDER — ORAL CARE MOUTH RINSE: 15.0000 mL | Freq: Once | OROMUCOSAL | Status: AC

## 2024-01-17 MED ORDER — SUGAMMADEX SODIUM 200 MG/2ML IV SOLN
INTRAVENOUS | Status: DC | PRN
  Administered 2024-01-17: 200 mg via INTRAVENOUS

## 2024-01-17 MED ORDER — LIDOCAINE-EPINEPHRINE 0.5 %-1:200000 IJ SOLN
INTRAMUSCULAR | Status: AC
  Filled 2024-01-17: qty 50

## 2024-01-17 MED ORDER — FENTANYL CITRATE (PF) 100 MCG/2ML IJ SOLN: 25.0000 ug | INTRAMUSCULAR | Status: DC | PRN

## 2024-01-17 MED ORDER — BUPIVACAINE HCL (PF) 0.5 % IJ SOLN
INTRAMUSCULAR | Status: AC
  Filled 2024-01-17: qty 30

## 2024-01-17 MED ORDER — THROMBIN (RECOMBINANT) 5000 UNITS EX SOLR
CUTANEOUS | Status: DC | PRN
  Administered 2024-01-17: 10 mL via TOPICAL

## 2024-01-17 MED ORDER — ROCURONIUM BROMIDE 10 MG/ML (PF) SYRINGE
PREFILLED_SYRINGE | INTRAVENOUS | Status: AC
  Filled 2024-01-17: qty 10

## 2024-01-17 MED ORDER — OXYCODONE HCL 5 MG PO TABS
5.0000 mg | ORAL_TABLET | Freq: Four times a day (QID) | ORAL | 0 refills | Status: AC | PRN
  Filled 2024-01-17: qty 30, 8d supply, fill #0

## 2024-01-17 MED ORDER — OXYCODONE HCL 5 MG/5ML PO SOLN: 5.0000 mg | Freq: Once | ORAL | Status: DC | PRN

## 2024-01-17 MED ORDER — THROMBIN 5000 UNITS EX KIT
PACK | CUTANEOUS | Status: AC
  Filled 2024-01-17: qty 2

## 2024-01-17 MED ORDER — PHENYLEPHRINE HCL-NACL 20-0.9 MG/250ML-% IV SOLN
INTRAVENOUS | Status: DC | PRN
  Administered 2024-01-17: 25 ug/min via INTRAVENOUS

## 2024-01-17 MED ORDER — ACETAMINOPHEN 500 MG PO TABS
1000.0000 mg | ORAL_TABLET | Freq: Once | ORAL | Status: AC
  Administered 2024-01-17: 1000 mg via ORAL
  Filled 2024-01-17: qty 2

## 2024-01-17 MED ORDER — DEXAMETHASONE SODIUM PHOSPHATE 10 MG/ML IJ SOLN
INTRAMUSCULAR | Status: DC | PRN
  Administered 2024-01-17: 10 mg via INTRAVENOUS

## 2024-01-17 MED ORDER — PROPOFOL 10 MG/ML IV BOLUS
INTRAVENOUS | Status: DC | PRN
  Administered 2024-01-17: 125 ug/kg/min via INTRAVENOUS
  Administered 2024-01-17: 160 mg via INTRAVENOUS

## 2024-01-17 MED ORDER — 0.9 % SODIUM CHLORIDE (POUR BTL) OPTIME
TOPICAL | Status: DC | PRN
  Administered 2024-01-17: 1000 mL

## 2024-01-17 MED ORDER — BUPIVACAINE HCL (PF) 0.5 % IJ SOLN
INTRAMUSCULAR | Status: AC
  Filled 2024-01-17: qty 10

## 2024-01-17 MED ORDER — PROPOFOL 1000 MG/100ML IV EMUL
INTRAVENOUS | Status: AC
  Filled 2024-01-17: qty 100

## 2024-01-17 MED ORDER — PROPOFOL 10 MG/ML IV BOLUS
INTRAVENOUS | Status: AC
  Filled 2024-01-17: qty 20

## 2024-01-17 MED ORDER — FENTANYL CITRATE (PF) 250 MCG/5ML IJ SOLN
INTRAMUSCULAR | Status: DC | PRN
  Administered 2024-01-17: 25 ug via INTRAVENOUS
  Administered 2024-01-17 (×2): 50 ug via INTRAVENOUS

## 2024-01-17 SURGICAL SUPPLY — 41 items
BAG COUNTER SPONGE SURGICOUNT (BAG) ×1 IMPLANT
BENZOIN TINCTURE PRP APPL 2/3 (GAUZE/BANDAGES/DRESSINGS) IMPLANT
BLADE CLIPPER SURG (BLADE) IMPLANT
BUR MATCHSTICK NEURO 3.0 LAGG (BURR) ×1 IMPLANT
BUR PRECISION FLUTE 5.0 (BURR) IMPLANT
CANISTER SUCTION 3000ML PPV (SUCTIONS) ×1 IMPLANT
DERMABOND ADVANCED .7 DNX12 (GAUZE/BANDAGES/DRESSINGS) ×1 IMPLANT
DRAPE LAPAROTOMY 100X72X124 (DRAPES) ×1 IMPLANT
DRAPE MICROSCOPE LEICA (MISCELLANEOUS) IMPLANT
DRAPE MICROSCOPE SLANT 54X150 (MISCELLANEOUS) ×1 IMPLANT
DRAPE SURG 17X23 STRL (DRAPES) ×1 IMPLANT
DURAPREP 26ML APPLICATOR (WOUND CARE) ×1 IMPLANT
ELECTRODE REM PT RTRN 9FT ADLT (ELECTROSURGICAL) ×1 IMPLANT
GAUZE 4X4 16PLY ~~LOC~~+RFID DBL (SPONGE) IMPLANT
GAUZE SPONGE 4X4 12PLY STRL (GAUZE/BANDAGES/DRESSINGS) IMPLANT
GLOVE ECLIPSE 6.5 STRL STRAW (GLOVE) ×1 IMPLANT
GLOVE EXAM NITRILE XL STR (GLOVE) IMPLANT
GOWN STRL REUS W/ TWL LRG LVL3 (GOWN DISPOSABLE) ×2 IMPLANT
GOWN STRL REUS W/ TWL XL LVL3 (GOWN DISPOSABLE) IMPLANT
GOWN STRL REUS W/TWL 2XL LVL3 (GOWN DISPOSABLE) IMPLANT
KIT BASIN OR (CUSTOM PROCEDURE TRAY) ×1 IMPLANT
KIT TURNOVER KIT B (KITS) ×1 IMPLANT
NDL HYPO 25X1 1.5 SAFETY (NEEDLE) ×1 IMPLANT
NDL SPNL 18GX3.5 QUINCKE PK (NEEDLE) IMPLANT
NEEDLE HYPO 25X1 1.5 SAFETY (NEEDLE) ×1 IMPLANT
NEEDLE SPNL 18GX3.5 QUINCKE PK (NEEDLE) IMPLANT
PACK LAMINECTOMY NEURO (CUSTOM PROCEDURE TRAY) ×1 IMPLANT
PAD ARMBOARD POSITIONER FOAM (MISCELLANEOUS) ×3 IMPLANT
SOLN 0.9% NACL 1000 ML (IV SOLUTION) ×1 IMPLANT
SOLN 0.9% NACL POUR BTL 1000ML (IV SOLUTION) ×1 IMPLANT
SOLN STERILE WATER 1000 ML (IV SOLUTION) ×1 IMPLANT
SOLN STERILE WATER BTL 1000 ML (IV SOLUTION) ×1 IMPLANT
SPIKE FLUID TRANSFER (MISCELLANEOUS) ×1 IMPLANT
SPONGE SURGIFOAM ABS GEL SZ50 (HEMOSTASIS) ×1 IMPLANT
SPONGE T-LAP 4X18 ~~LOC~~+RFID (SPONGE) IMPLANT
STRIP CLOSURE SKIN 1/2X4 (GAUZE/BANDAGES/DRESSINGS) IMPLANT
SUT VIC AB 0 CT1 18XCR BRD8 (SUTURE) ×1 IMPLANT
SUT VIC AB 2-0 CT1 18 (SUTURE) ×1 IMPLANT
SUT VIC AB 3-0 SH 8-18 (SUTURE) ×1 IMPLANT
TOWEL GREEN STERILE (TOWEL DISPOSABLE) ×1 IMPLANT
TOWEL GREEN STERILE FF (TOWEL DISPOSABLE) ×1 IMPLANT

## 2024-01-17 NOTE — Discharge Instructions (Signed)
 Lumbar Discectomy Care After A discectomy involves removal of discmaterial (the cartilage-like structures located between the bones of the back). It is done to relieve pressure on nerve roots. It can be used as a treatment for a back problem. The time in surgery depends on the findings in surgery and what is necessary to correct the problems. HOME CARE INSTRUCTIONS   Check the cut (incision) made by the surgeon twice a day for signs of infection. Some signs of infection may include:   A foul smelling, greenish or yellowish discharge from the wound.   Increased pain.   Increased redness over the incision (operative) site.   The skin edges may separate.   Flu-like symptoms (problems).   A temperature above 101.5 F (38.6 C).   Change your bandages in about 24 to 36 hours following surgery or as directed.   You may shower tomrrow.  Avoid bathtubs, swimming pools and hot tubs for three weeks or until your incision has healed completely.  Follow your doctor's instructions as to safe activities, exercises, and physical therapy.   Weight reduction may be beneficial if you are overweight.   Daily exercise is helpful to prevent the return of problems. Walking is permitted. You may use a treadmill without an incline. Cut down on activities and exercise if you have discomfort. You may also go up and down stairs as much as you can tolerate.   DO NOT lift anything heavier than 10 to 15 lbs. Avoid bending or twisting at the waist. Always bend your knees when lifting.   Maintain strength and range of motion as instructed.   Do not drive for 10 days, or as directed by your doctors. You may be a passenger . Lying back in the passenger seat may be more comfortable for you. Always wear a seatbelt.   Limit your sitting in a regular chair to 20 to 30 minutes at a time. There are no limitations for sitting in a recliner. You should lie down or walk in between sitting periods.   Only take  over-the-counter or prescription medicines for pain, discomfort, or fever as directed by your caregiver.  SEEK MEDICAL CARE IF:   There is increased bleeding (more than a small spot) from the wound.   You notice redness, swelling, or increasing pain in the wound.   Pus is coming from wound.   You develop an unexplained oral temperature above 102 F (38.9 C) develops.   You notice a foul smell coming from the wound or dressing.   You have increasing pain in your wound.  SEEK IMMEDIATE MEDICAL CARE IF:   You develop a rash.   You have difficulty breathing.   You develop any allergic problems to medicines given.  Document Released: 03/14/2004 Document Revised: 03/29/2011 Document Reviewed: 07/03/2007 [employer] Patient Information

## 2024-01-17 NOTE — Discharge Summary (Signed)
 Physician Discharge Summary  Patient ID: David Villanueva MRN: [id number] DOB/AGE: 1950-07-05 74 y.o.  Admit date: 01/17/2024 Discharge date: 01/17/2024  Admission Diagnoses:hnp right L2/3  Discharge Diagnoses: same Active Problems:   * No active hospital problems. *   Discharged Condition: good  Hospital Course: David Villanueva was admitted and taken to the hospital for an uncomplicated right L2 laminectomy and discetomy. Post op he is ambulating, voiding, and tolerating a regular diet. His wound is clean, dry, and without signs of infection.  Treatments: surgery: LUMBAR /DECOMPRESSION MICRODISCECTOMY Lumbar Two-Three   Discharge Exam: Blood pressure 103/66, pulse 70, temperature 97.9 F (36.6 C), resp. rate 16, height 5' 10 (1.778 m), weight 100.7 kg, SpO2 93%. General appearance: alert, cooperative, appears stated age, and no distress  Disposition: Discharge disposition: 01-Home or Self Care      Disc displacement, lumbar Discharge Instructions     Call MD for:  redness, tenderness, or signs of infection (pain, swelling, redness, odor or green/yellow discharge around incision site)   Complete by: As directed    Call MD for:  severe uncontrolled pain   Complete by: As directed    Call MD for:  temperature >100.4   Complete by: As directed    Diet general   Complete by: As directed    Discharge wound care:   Complete by: As directed    You have glue on your incision. You may shower tomorrow, and the incision can get wet. Don't scrub the incision directly.   Increase activity slowly   Complete by: As directed          Follow-up Information     Gillie Duncans, MD Follow up.   Specialty: Neurosurgery Why: keep your scheduled appointment Contact information: [address] [place] [address] [phone]                 Signed: Duncans Gillie 01/17/2024, 3:29 PM

## 2024-01-17 NOTE — H&P (Signed)
 David Villanueva is an 73 y.o. male.   Chief Complaint: right lower extremity pain HPI: right hnp with radiculopathy  Past Medical History:  Diagnosis Date   Arthritis    CAD (coronary artery disease)    nonobstructive   Fatty liver    Gallstones    Headache(784.0)    prev history, extensive w/u with no clear diagnosis   history of CT of pelvis 11/10/2001   normal   Hyperlipidemia    Hypertension    Liver cyst    Pneumonia    Prostatitis 10/2001   Prostate abscess (Dr. Chales)    Past Surgical History:  Procedure Laterality Date   ACROMIO-CLAVICULAR JOINT REPAIR  1981   Carotid Doppler  07/22/2000   Negative, also MRA negative   COLONOSCOPY  2024   MRI//MRA Brain  04/23/2000   Negative   TEMPORAL ARTERY BIOPSY / LIGATION  04/23/2000   Negative   TONSILLECTOMY  04/23/1970   VASECTOMY      Family History  Problem Relation Age of Onset   Stroke Mother    Heart disease Father        open heart surgery//   Heart attack Father        multiple times   Colon cancer Maternal Aunt        dx'd late in life   Colon cancer Maternal Grandfather    Hypertension Paternal Grandmother        also hypertension    Stroke Paternal Grandfather        also cancer   Prostate cancer Neg Hx    Colon polyps Neg Hx    Esophageal cancer Neg Hx    Stomach cancer Neg Hx    Rectal cancer Neg Hx    Social History:  reports that he has never smoked. He has never used smokeless tobacco. He reports that he does not drink alcohol and does not use drugs.  Allergies:  Allergies  Allergen Reactions   Atorvastatin  Calcium  Other (See Comments)    myalgias   Meperidine Hcl Itching    Medications Prior to Admission  Medication Sig Dispense Refill   aspirin EC 81 MG tablet Take 81 mg by mouth daily. Swallow whole.     chlorthalidone  (HYGROTON ) 25 MG tablet Take 1 tablet (25 mg total) by mouth daily. 90 tablet 3   amLODipine  (NORVASC ) 10 MG tablet TAKE 1 TABLET BY MOUTH EVERY DAY 90 tablet 2     Results for orders placed or performed during the hospital encounter of 01/16/24 (from the past 48 hours)  Surgical pcr screen     Status: None   Collection Time: 01/16/24  8:34 AM   Specimen: Nasal Mucosa; Nasal Swab  Result Value Ref Range   MRSA, PCR NEGATIVE NEGATIVE   Staphylococcus aureus NEGATIVE NEGATIVE    Comment: (NOTE) The Xpert SA Assay (FDA approved for NASAL specimens in patients 45 years of age and older), is one component of a comprehensive surveillance program. It is not intended to diagnose infection nor to guide or monitor treatment. Performed at [facility], [address], [place], [place] [address]   Comprehensive metabolic panel per protocol     Status: None   Collection Time: 01/16/24  9:00 AM  Result Value Ref Range   Sodium 139 135 - 145 mmol/L   Potassium 3.7 3.5 - 5.1 mmol/L   Chloride 102 98 - 111 mmol/L   CO2 27 22 - 32 mmol/L   Glucose, Bld 86  70 - 99 mg/dL    Comment: Glucose reference range applies only to samples taken after fasting for at least 8 hours.   BUN 8 8 - 23 mg/dL   Creatinine, Ser 8.93 0.61 - 1.24 mg/dL   Calcium  9.6 8.9 - 10.3 mg/dL   Total Protein 7.0 6.5 - 8.1 g/dL   Albumin 3.8 3.5 - 5.0 g/dL   AST 25 15 - 41 U/L   ALT 19 0 - 44 U/L   Alkaline Phosphatase 45 38 - 126 U/L   Total Bilirubin 1.0 0.0 - 1.2 mg/dL   GFR, Estimated >39 >39 mL/min    Comment: (NOTE) Calculated using the CKD-EPI Creatinine Equation (2021)    Anion gap 10 5 - 15    Comment: Performed at [facility], [address] [place], [place] [address]  CBC per protocol     Status: None   Collection Time: 01/16/24  9:00 AM  Result Value Ref Range   WBC 6.0 4.0 - 10.5 K/uL   RBC 5.22 4.22 - 5.81 MIL/uL   Hemoglobin 16.4 13.0 - 17.0 g/dL   HCT 52.1 60.9 - 47.9 %   MCV 91.6 80.0 - 100.0 fL   MCH 31.4 26.0 - 34.0 pg   MCHC 34.3 30.0 - 36.0 g/dL   RDW 86.0 88.4 - 84.4 %   Platelets 240 150 - 400 K/uL   nRBC 0.0 0.0 - 0.2 %    Comment:  Performed at [facility], [address], [place], [place] [address]   No results found.  Review of Systems  Constitutional: Negative.   HENT: Negative.    Eyes: Negative.   Respiratory: Negative.    Cardiovascular: Negative.   Gastrointestinal: Negative.   Endocrine: Negative.   Genitourinary: Negative.   Musculoskeletal:  Positive for back pain.  Skin: Negative.   Allergic/Immunologic: Negative.   Neurological:  Positive for weakness.  Hematological: Negative.   Psychiatric/Behavioral: Negative.      Blood pressure (!) 146/81, pulse (!) 58, temperature 97.6 F (36.4 C), temperature source Oral, resp. rate 17, height 5' 10 (1.778 m), weight 100.7 kg, SpO2 95%. Physical Exam Constitutional:      Appearance: Normal appearance. He is normal weight.  HENT:     Head: Normocephalic and atraumatic.     Right Ear: External ear normal.     Left Ear: External ear normal.     Nose: Nose normal.     Mouth/Throat:     Mouth: Mucous membranes are moist.     Pharynx: Oropharynx is clear.  Eyes:     Extraocular Movements: Extraocular movements intact.     Conjunctiva/sclera: Conjunctivae normal.     Pupils: Pupils are equal, round, and reactive to light.  Cardiovascular:     Rate and Rhythm: Regular rhythm.  Pulmonary:     Effort: Pulmonary effort is normal.  Abdominal:     General: Abdomen is flat.     Palpations: Abdomen is soft.  Musculoskeletal:        General: Normal range of motion.  Skin:    General: Skin is warm and dry.  Neurological:     General: No focal deficit present.     Mental Status: He is alert.     Cranial Nerves: No cranial nerve deficit.     Motor: Weakness present.     Gait: Gait abnormal.  Psychiatric:        Mood and Affect: Mood normal.  Behavior: Behavior normal.        Thought Content: Thought content normal.        Judgment: Judgment normal.      Assessment/Plan Large hnp right side. OR for discetomy.   Rockey Peru,  MD 01/17/2024, 12:48 PM

## 2024-01-17 NOTE — Anesthesia Procedure Notes (Signed)
 Procedure Name: Intubation Date/Time: 01/17/2024 1:10 PM  Performed by: Atanacio Arland HERO, CRNAPre-anesthesia Checklist: Patient identified, Emergency Drugs available, Suction available and Patient being monitored Patient Re-evaluated:Patient Re-evaluated prior to induction Oxygen Delivery Method: Circle System Utilized Preoxygenation: Pre-oxygenation with 100% oxygen Induction Type: IV induction Ventilation: Two handed mask ventilation required Laryngoscope Size: Glidescope and 4 Grade View: Grade I Tube type: Oral Tube size: 7.5 mm Number of attempts: 1 Airway Equipment and Method: Stylet and Oral airway Placement Confirmation: ETT inserted through vocal cords under direct vision, positive ETCO2 and breath sounds checked- equal and bilateral Secured at: 23 cm Tube secured with: Tape Dental Injury: Teeth and Oropharynx as per pre-operative assessment

## 2024-01-17 NOTE — Op Note (Signed)
 01/17/2024  3:08 PM  PATIENT:  David Villanueva  73 y.o. male  PRE-OPERATIVE DIAGNOSIS:  Disc displacement, lumbar 2/3 right  POST-OPERATIVE DIAGNOSIS:  Disc displacement, lumbar 2/3 right  PROCEDURE:  Procedure(s):right LUMBAR /DECOMPRESSION MICRODISCECTOMY Lumbar Two-Three  SURGEON:   Surgeon(s): Gillie Duncans, MD  ASSISTANTS:none  ANESTHESIA:   general  EBL:  Total I/O In: 1400 [I.V.:1300; IV Piggyback:100] Out: 10 [Blood:10]  BLOOD ADMINISTERED:none  CELL SAVER GIVEN:none  COUNT:per nursing  DRAINS: none   SPECIMEN:  No Specimen  DICTATION: [name] was taken to the operating room, intubated and placed under a general anesthetic without difficulty. He was positioned prone on a Wilson frame with all pressure points padded. His back was prepped and draped in a sterile manner. I opened the skin with a 10 blade and carried the dissection down to the thoracolumbar fascia. I used both sharp dissection and the monopolar cautery to expose the lamina of L2, and L3. I confirmed my location with an intraoperative xray.  I used the drill, Kerrison punches, and curettes to perform a semihemilaminectomy of L2 on the right. I used the punches to remove the ligamentum flavum to expose the thecal sac. I brought the microscope into the operative field and started decompressing the spinal canal, thecal sac and right root(s). I cauterized epidural veins overlying the disc space then divided them sharply. I opened the disc space with a 15 blade and proceeded with the discectomy. I used pituitary rongeurs, curettes, and other instruments to remove disc material. After the discectomy was completed I inspected the right L3 nerve root and felt it was well decompressed. I explored rostrally, laterally, medially, and caudally and was satisfied with the decompression. I irrigated the wound, then closed in layers. I approximated the thoracolumbar fascia, subcutaneous, and subcuticular planes with vicryl  sutures. I used dermabond for a sterile dressing.   PLAN OF CARE: Discharge to home after PACU  PATIENT DISPOSITION:  PACU - hemodynamically stable.   Delay start of Pharmacological VTE agent (>24hrs) due to surgical blood loss or risk of bleeding:  yes

## 2024-01-17 NOTE — Transfer of Care (Signed)
 Immediate Anesthesia Transfer of Care Note  Patient: David Villanueva  Procedure(s) Performed: LUMBAR /DECOMPRESSION MICRODISCECTOMY Lumbar Two-Three (Right)  Patient Location: PACU  Anesthesia Type:General  Level of Consciousness: drowsy  Airway & Oxygen Therapy: Patient Spontanous Breathing and Patient connected to face mask oxygen  Post-op Assessment: Report given to RN and Post -op Vital signs reviewed and stable  Post vital signs: Reviewed and stable  Last Vitals:  Vitals Value Taken Time  BP 116/64 01/17/24 14:38  Temp 97.9   Pulse 78 01/17/24 14:40  Resp 16 01/17/24 14:40  SpO2 93 % 01/17/24 14:40  Vitals shown include unfiled device data.  Last Pain:  Vitals:   01/17/24 0934  TempSrc:   PainSc: 5       Patients Stated Pain Goal: 2 (01/17/24 0934)  Complications: No notable events documented.

## 2024-01-20 ENCOUNTER — Ambulatory Visit: Admitting: Cardiovascular Disease

## 2024-01-20 ENCOUNTER — Encounter (HOSPITAL_COMMUNITY): Payer: Self-pay | Admitting: Neurosurgery

## 2024-01-20 MED FILL — Thrombin For Soln 5000 Unit: CUTANEOUS | Qty: 2 | Status: AC

## 2024-01-20 NOTE — Anesthesia Postprocedure Evaluation (Signed)
 Anesthesia Post Note  Patient: Josel Keo  Procedure(s) Performed: LUMBAR /DECOMPRESSION MICRODISCECTOMY Lumbar Two-Three (Right)     Patient location during evaluation: PACU Anesthesia Type: General Level of consciousness: awake and alert Pain management: pain level controlled Vital Signs Assessment: post-procedure vital signs reviewed and stable Respiratory status: spontaneous breathing, nonlabored ventilation, respiratory function stable and patient connected to nasal cannula oxygen Cardiovascular status: blood pressure returned to baseline and stable Postop Assessment: no apparent nausea or vomiting Anesthetic complications: no   No notable events documented.  Last Vitals:  Vitals:   01/17/24 1545 01/17/24 1600  BP: 112/76 117/76  Pulse: 61 (!) 59  Resp: 13 14  Temp:  36.5 C  SpO2: 93% 94%    Last Pain:  Vitals:   01/17/24 1530  TempSrc:   PainSc: 0-No pain                 Epifanio Lamar BRAVO

## 2024-01-30 ENCOUNTER — Ambulatory Visit: Admitting: Internal Medicine

## 2024-01-30 ENCOUNTER — Other Ambulatory Visit: Payer: Self-pay | Admitting: Neurosurgery

## 2024-01-30 DIAGNOSIS — G8929 Other chronic pain: Secondary | ICD-10-CM

## 2024-01-31 ENCOUNTER — Ambulatory Visit

## 2024-02-01 ENCOUNTER — Ambulatory Visit
Admission: RE | Admit: 2024-02-01 | Discharge: 2024-02-01 | Disposition: A | Discharge status: Home / Self Care | Source: Ambulatory Visit | Attending: Neurosurgery | Admitting: Neurosurgery

## 2024-02-01 DIAGNOSIS — M25511 Pain in right shoulder: Secondary | ICD-10-CM | POA: Diagnosis present

## 2024-02-01 DIAGNOSIS — G8929 Other chronic pain: Secondary | ICD-10-CM | POA: Diagnosis present

## 2024-04-01 ENCOUNTER — Ambulatory Visit: Admitting: Cardiovascular Disease

## 2024-04-02 ENCOUNTER — Institutional Professional Consult (permissible substitution) (HOSPITAL_BASED_OUTPATIENT_CLINIC_OR_DEPARTMENT_OTHER): Admitting: Internal Medicine

## 2024-04-03 ENCOUNTER — Other Ambulatory Visit: Payer: Self-pay | Admitting: Neurosurgery

## 2024-04-03 DIAGNOSIS — M48062 Spinal stenosis, lumbar region with neurogenic claudication: Secondary | ICD-10-CM
